# Patient Record
Sex: Female | Born: 1950 | Race: Black or African American | Hispanic: No | Marital: Married | Smoking: Former smoker
Health system: Southern US, Community
[De-identification: ages and names within clinical notes are randomized; demographics above are authoritative.]

## PROBLEM LIST (undated history)

## (undated) DIAGNOSIS — E119 Type 2 diabetes mellitus without complications: Secondary | ICD-10-CM

## (undated) DIAGNOSIS — E871 Hypo-osmolality and hyponatremia: Secondary | ICD-10-CM

## (undated) DIAGNOSIS — M75101 Unspecified rotator cuff tear or rupture of right shoulder, not specified as traumatic: Secondary | ICD-10-CM

## (undated) DIAGNOSIS — I10 Essential (primary) hypertension: Secondary | ICD-10-CM

## (undated) DIAGNOSIS — J449 Chronic obstructive pulmonary disease, unspecified: Secondary | ICD-10-CM

## (undated) DIAGNOSIS — I2510 Atherosclerotic heart disease of native coronary artery without angina pectoris: Secondary | ICD-10-CM

## (undated) DIAGNOSIS — Z955 Presence of coronary angioplasty implant and graft: Secondary | ICD-10-CM

## (undated) DIAGNOSIS — M7540 Impingement syndrome of unspecified shoulder: Secondary | ICD-10-CM

## (undated) DIAGNOSIS — Z87891 Personal history of nicotine dependence: Secondary | ICD-10-CM

## (undated) DIAGNOSIS — F1291 Cannabis use, unspecified, in remission: Secondary | ICD-10-CM

## (undated) DIAGNOSIS — E785 Hyperlipidemia, unspecified: Secondary | ICD-10-CM

## (undated) HISTORY — PX: CORONARY ANGIOPLASTY WITH STENT PLACEMENT: SHX49

## (undated) HISTORY — PX: TUBAL LIGATION: SHX77

---

## 2005-06-14 ENCOUNTER — Emergency Department: Payer: Self-pay | Admitting: Emergency Medicine

## 2005-06-14 ENCOUNTER — Other Ambulatory Visit: Payer: Self-pay

## 2007-02-05 ENCOUNTER — Emergency Department: Payer: Self-pay | Admitting: General Practice

## 2008-04-16 ENCOUNTER — Emergency Department: Payer: Self-pay | Admitting: Emergency Medicine

## 2008-04-24 ENCOUNTER — Ambulatory Visit: Payer: Self-pay | Admitting: Family Medicine

## 2009-09-29 ENCOUNTER — Inpatient Hospital Stay: Payer: Self-pay | Admitting: Specialist

## 2011-07-21 ENCOUNTER — Ambulatory Visit: Payer: Self-pay

## 2011-12-07 ENCOUNTER — Ambulatory Visit: Payer: Self-pay | Admitting: Cardiovascular Disease

## 2011-12-08 LAB — BASIC METABOLIC PANEL
BUN: 9 mg/dL (ref 7–18)
Creatinine: 0.8 mg/dL (ref 0.60–1.30)
EGFR (African American): >60
EGFR (Non-African Amer.): >60
Glucose: 249 mg/dL — ABNORMAL HIGH (ref 65–99)
Potassium: 4 mmol/L (ref 3.5–5.1)
Sodium: 140 mmol/L (ref 136–145)

## 2011-12-21 ENCOUNTER — Encounter: Payer: Self-pay | Admitting: Cardiovascular Disease

## 2011-12-31 ENCOUNTER — Encounter: Payer: Self-pay | Admitting: Cardiovascular Disease

## 2012-01-31 ENCOUNTER — Encounter: Payer: Self-pay | Admitting: Cardiovascular Disease

## 2012-03-04 ENCOUNTER — Emergency Department: Payer: Self-pay | Admitting: Internal Medicine

## 2012-03-05 ENCOUNTER — Encounter: Payer: Self-pay | Admitting: Cardiovascular Disease

## 2012-10-23 ENCOUNTER — Ambulatory Visit: Payer: Self-pay | Admitting: Internal Medicine

## 2013-08-23 ENCOUNTER — Ambulatory Visit: Payer: Self-pay | Admitting: Internal Medicine

## 2013-09-01 ENCOUNTER — Ambulatory Visit: Payer: Self-pay | Admitting: Internal Medicine

## 2013-09-16 ENCOUNTER — Emergency Department: Payer: Self-pay | Admitting: Emergency Medicine

## 2013-09-16 LAB — CBC
HCT: 44.3 % (ref 35.0–47.0)
HGB: 14.5 g/dL (ref 12.0–16.0)
MCV: 89 fL (ref 80–100)
RBC: 5 10*6/uL (ref 3.80–5.20)

## 2013-09-16 LAB — TROPONIN I: Troponin-I: <0.02 ng/mL

## 2013-09-16 LAB — CK TOTAL AND CKMB (NOT AT ARMC): CK-MB: 2.4 ng/mL (ref 0.5–3.6)

## 2013-09-16 LAB — BASIC METABOLIC PANEL
BUN: 18 mg/dL (ref 7–18)
Chloride: 106 mmol/L (ref 98–107)
EGFR (African American): 56 — ABNORMAL LOW
EGFR (Non-African Amer.): 48 — ABNORMAL LOW
Glucose: 298 mg/dL — ABNORMAL HIGH (ref 65–99)
Potassium: 3.6 mmol/L (ref 3.5–5.1)
Sodium: 137 mmol/L (ref 136–145)

## 2013-09-16 LAB — PROTIME-INR: Prothrombin Time: 11.8 secs (ref 11.5–14.7)

## 2013-09-17 LAB — TROPONIN I: Troponin-I: <0.02 ng/mL

## 2014-02-20 ENCOUNTER — Ambulatory Visit: Payer: Self-pay | Admitting: Internal Medicine

## 2014-02-23 ENCOUNTER — Emergency Department: Payer: Self-pay | Admitting: Internal Medicine

## 2014-03-15 ENCOUNTER — Ambulatory Visit: Payer: Self-pay | Admitting: Orthopedic Surgery

## 2015-02-23 NOTE — Discharge Summary (Signed)
PATIENT NAME:  Brenda Best, Brenda J MR#:  Best DATE OF BIRTH:  1951/06/21  DATE OF ADMISSION:  12/07/2011 DATE OF DISCHARGE:  12/08/2011  HISTORY OF PRESENT ILLNESS/HOSPITAL COURSE: This is a 64 year old female who came into the hospital for outpatient cardiac catheterization. She had a 75% lesion in the proximal RCA and 70% in the mid to distal RCA, thus underwent angioplasty and drug-eluting stent placement in the right coronary artery in the proximal as well as mid to distal right coronary. She was observed overnight and is being discharged with follow-up in the office in two days.   CONDITION ON DISCHARGE: She is being discharged in stable condition.  DISCHARGE MEDICATIONS:  1. Aspirin 325. 2. Plavix 75 mg p.o. daily.   3. Usual medications.   Her sugar was elevated to 300, thus she was being placed on metformin also before being discharged. She will be set up for medical doctor in [facility].   DISCHARGE DIAGNOSES:  1. Coronary artery disease status post PCI stenting with drug-eluting stent in the right coronary in the proximal and mid to distal right coronary. 2. Elevated blood sugar most likely due to diabetes. 3. Hypertension. 4. Hypercholesterolemia. 5. Nicotine addiction.   FOLLOW-UP: Follow-up in the office this week.   ____________________________ Laurier NancyShaukat A. Khan, MD sak:drc D: 12/09/2011 09:15:32 ET T: 12/09/2011 09:23:49 ET JOB#: [id number] cc: Laurier NancyShaukat A. Khan, MD, <Dictator> Laurier NancySHAUKAT A KHAN MD ELECTRONICALLY SIGNED 12/22/2011 11:22

## 2015-06-12 ENCOUNTER — Other Ambulatory Visit: Payer: Self-pay | Admitting: Internal Medicine

## 2015-06-12 DIAGNOSIS — Z1231 Encounter for screening mammogram for malignant neoplasm of breast: Secondary | ICD-10-CM

## 2015-06-13 ENCOUNTER — Ambulatory Visit: Payer: Self-pay | Attending: Internal Medicine

## 2016-02-09 ENCOUNTER — Other Ambulatory Visit: Payer: Self-pay | Admitting: Internal Medicine

## 2016-02-09 DIAGNOSIS — M25512 Pain in left shoulder: Secondary | ICD-10-CM

## 2016-02-09 DIAGNOSIS — M25511 Pain in right shoulder: Secondary | ICD-10-CM

## 2016-03-01 ENCOUNTER — Ambulatory Visit
Admission: RE | Admit: 2016-03-01 | Discharge: 2016-03-01 | Disposition: A | Discharge status: Home / Self Care | Payer: Medicare HMO | Source: Ambulatory Visit | Attending: Internal Medicine | Admitting: Internal Medicine

## 2016-03-01 DIAGNOSIS — M25512 Pain in left shoulder: Secondary | ICD-10-CM | POA: Insufficient documentation

## 2016-03-01 DIAGNOSIS — M25511 Pain in right shoulder: Secondary | ICD-10-CM

## 2016-03-01 DIAGNOSIS — M75112 Incomplete rotator cuff tear or rupture of left shoulder, not specified as traumatic: Secondary | ICD-10-CM | POA: Diagnosis not present

## 2016-03-01 DIAGNOSIS — M75121 Complete rotator cuff tear or rupture of right shoulder, not specified as traumatic: Secondary | ICD-10-CM | POA: Diagnosis not present

## 2016-03-01 DIAGNOSIS — M62511 Muscle wasting and atrophy, not elsewhere classified, right shoulder: Secondary | ICD-10-CM | POA: Diagnosis not present

## 2016-03-01 DIAGNOSIS — M13811 Other specified arthritis, right shoulder: Secondary | ICD-10-CM | POA: Insufficient documentation

## 2016-03-01 DIAGNOSIS — M13812 Other specified arthritis, left shoulder: Secondary | ICD-10-CM | POA: Insufficient documentation

## 2016-05-18 ENCOUNTER — Encounter: Payer: Self-pay | Admitting: Emergency Medicine

## 2016-05-18 ENCOUNTER — Emergency Department
Admission: EM | Admit: 2016-05-18 | Discharge: 2016-05-18 | Disposition: A | Discharge status: Home / Self Care | Payer: Medicare HMO | Attending: Emergency Medicine | Admitting: Emergency Medicine

## 2016-05-18 DIAGNOSIS — I2510 Atherosclerotic heart disease of native coronary artery without angina pectoris: Secondary | ICD-10-CM | POA: Diagnosis not present

## 2016-05-18 DIAGNOSIS — S00412A Abrasion of left ear, initial encounter: Secondary | ICD-10-CM

## 2016-05-18 DIAGNOSIS — I10 Essential (primary) hypertension: Secondary | ICD-10-CM | POA: Diagnosis not present

## 2016-05-18 DIAGNOSIS — Y939 Activity, unspecified: Secondary | ICD-10-CM | POA: Insufficient documentation

## 2016-05-18 DIAGNOSIS — Y999 Unspecified external cause status: Secondary | ICD-10-CM | POA: Diagnosis not present

## 2016-05-18 DIAGNOSIS — Z87891 Personal history of nicotine dependence: Secondary | ICD-10-CM | POA: Diagnosis not present

## 2016-05-18 DIAGNOSIS — Y929 Unspecified place or not applicable: Secondary | ICD-10-CM | POA: Insufficient documentation

## 2016-05-18 DIAGNOSIS — X58XXXA Exposure to other specified factors, initial encounter: Secondary | ICD-10-CM | POA: Insufficient documentation

## 2016-05-18 DIAGNOSIS — H9222 Otorrhagia, left ear: Secondary | ICD-10-CM | POA: Diagnosis present

## 2016-05-18 HISTORY — DX: Essential (primary) hypertension: I10

## 2016-05-18 HISTORY — DX: Atherosclerotic heart disease of native coronary artery without angina pectoris: I25.10

## 2016-05-18 MED ORDER — CIPROFLOXACIN-DEXAMETHASONE 0.3-0.1 % OT SUSP
4.0000 [drp] | Freq: Once | OTIC | Status: AC
  Administered 2016-05-18: 4 [drp] via OTIC
  Filled 2016-05-18: qty 7.5

## 2016-05-18 NOTE — Discharge Instructions (Signed)
Use ciprodex twice daily for 3-5 days.   Change the cotton ball twice daily when you use your drops.   See your doctor.   Avoid putting anything else in the ear.   Return to ER if you have severe ear pain, uncontrolled bleeding from ear

## 2016-05-18 NOTE — ED Notes (Addendum)
Patient ambulatory to triage with steady gait, without difficulty or distress noted; pt reports awoke with bleeding from left ear canal; pt denies any recent illness; denies any c/o pain; pt currently taking plavix

## 2016-05-18 NOTE — ED Provider Notes (Signed)
CSN: [id number]     Arrival date & time 05/18/16  0600 History   First MD Initiated Contact with Patient 05/18/16 0703     Chief Complaint  Patient presents with  . Ear Drainage     (Consider location/radiation/quality/duration/timing/severity/associated sxs/prior Treatment) The history is provided by the patient.  Brenda Best is a 65 y.o. female hx of CAD, HTN, Here presenting with left ear bleeding. Patient states that her left ear is itchy so she is taking a Q-tip into the left ear canal. She then noticed profuse bleeding from the left ear. Denies any fall or injury to the year otherwise. She states that she is taking Plavix since she has cardiac stents but denies any chest pain or shortness of breath.    Past Medical History  Diagnosis Date  . Coronary artery disease   . Hypertension    History reviewed. No pertinent past surgical history. History reviewed. No pertinent family history. Social History  Substance Use Topics  . Smoking status: Former Smoker -- 0.50 packs/day    Types: Cigarettes  . Smokeless tobacco: None  . Alcohol Use: None   OB History    No data available     Review of Systems  HENT:       L ear bleeding   All other systems reviewed and are negative.     Allergies  Review of patient's allergies indicates no known allergies.  Home Medications   Prior to Admission medications   Not on File   BP 142/85 mmHg  Pulse 76  Temp(Src) 97.5 F (36.4 C) (Oral)  Resp 16  Ht 5\' 3"  (1.6 m)  Wt 162 lb (73.483 kg)  BMI 28.70 kg/m2  SpO2 96% Physical Exam  Constitutional: She is oriented to person, place, and time. She appears well-developed and well-nourished.  HENT:  Head: Normocephalic.  Mouth/Throat: Oropharynx is clear and moist.  Bleeding from L ear canal with small abrasion. L TM not perforated. No obvious laceration or otitis externa   Eyes: Conjunctivae are normal. Pupils are equal, round, and reactive to light.  Neck: Normal range of  motion. Neck supple.  Cardiovascular: Normal rate, regular rhythm and normal heart sounds.   Pulmonary/Chest: Effort normal and breath sounds normal.  Abdominal: Soft. Bowel sounds are normal. She exhibits no distension. There is no tenderness. There is no rebound.  Musculoskeletal: Normal range of motion. She exhibits no edema or tenderness.  Neurological: She is alert and oriented to person, place, and time.  Skin: Skin is warm and dry.  Psychiatric: She has a normal mood and affect. Her behavior is normal. Judgment and thought content normal.  Nursing note and vitals reviewed.   ED Course  Procedures (including critical care time) Labs Review Labs Reviewed - No data to display  Imaging Review No results found. I have personally reviewed and evaluated these images and lab results as part of my medical decision-making.   EKG Interpretation None      MDM   Final diagnoses:  None    Brenda Best is a 65 y.o. female here with L ear canal abrasion from Q tip. TM not perforated, no obvious otitis media. Afebrile. On plavix so she has some bleeding. Will give ciprodex for prophylaxis and place cotton ball to help with bleeding.   8:05 AM Cotton ball placed and bleeding stopped. Will dc home with ciprodex and I told her to apply cotton ball when she uses the drops. Gave strict return precautions.  Charlynne Pander, MD 05/18/16 [phone]

## 2016-07-22 ENCOUNTER — Other Ambulatory Visit: Payer: Self-pay | Admitting: Internal Medicine

## 2016-07-22 DIAGNOSIS — Z1231 Encounter for screening mammogram for malignant neoplasm of breast: Secondary | ICD-10-CM

## 2016-08-16 ENCOUNTER — Encounter: Payer: Self-pay | Admitting: Radiology

## 2016-08-16 ENCOUNTER — Ambulatory Visit
Admission: RE | Admit: 2016-08-16 | Discharge: 2016-08-16 | Disposition: A | Discharge status: Home / Self Care | Payer: Medicare HMO | Source: Ambulatory Visit | Attending: Internal Medicine | Admitting: Internal Medicine

## 2016-08-16 DIAGNOSIS — Z1231 Encounter for screening mammogram for malignant neoplasm of breast: Secondary | ICD-10-CM | POA: Insufficient documentation

## 2017-01-15 ENCOUNTER — Encounter: Payer: Self-pay | Admitting: Emergency Medicine

## 2017-01-15 ENCOUNTER — Emergency Department
Admission: EM | Admit: 2017-01-15 | Discharge: 2017-01-15 | Disposition: A | Discharge status: Home / Self Care | Payer: Medicare HMO | Attending: Emergency Medicine | Admitting: Emergency Medicine

## 2017-01-15 DIAGNOSIS — I10 Essential (primary) hypertension: Secondary | ICD-10-CM | POA: Diagnosis not present

## 2017-01-15 DIAGNOSIS — M7592 Shoulder lesion, unspecified, left shoulder: Secondary | ICD-10-CM | POA: Diagnosis not present

## 2017-01-15 DIAGNOSIS — I2510 Atherosclerotic heart disease of native coronary artery without angina pectoris: Secondary | ICD-10-CM | POA: Insufficient documentation

## 2017-01-15 DIAGNOSIS — Z87891 Personal history of nicotine dependence: Secondary | ICD-10-CM | POA: Insufficient documentation

## 2017-01-15 DIAGNOSIS — M25512 Pain in left shoulder: Secondary | ICD-10-CM | POA: Diagnosis present

## 2017-01-15 DIAGNOSIS — M7582 Other shoulder lesions, left shoulder: Secondary | ICD-10-CM

## 2017-01-15 MED ORDER — MELOXICAM 15 MG PO TABS: 15.0000 mg | ORAL_TABLET | Freq: Every day | ORAL | 0 refills | Status: DC

## 2017-01-15 NOTE — ED Triage Notes (Signed)
Had cortisone shot 5 months ago and last 3 weeks cramping muscles all over. Icy hot, tramadol, tylenol don't work. States was told needs rotator cuff surgery.

## 2017-01-15 NOTE — ED Triage Notes (Signed)
Pt states the only thing that works for the pain is the cortisone shot. Has not made appt with ortho for another one.

## 2017-01-15 NOTE — ED Provider Notes (Signed)
[facility] Emergency Department Provider Note  ____________________________________________  Time seen: Approximately 4:05 PM  I have reviewed the triage vital signs and the nursing notes.   HISTORY  Chief Complaint Joint Pain    HPI Brenda PlumeBarbara J Best is a 66 y.o. female with bilateral shoulder pain. She has been seeing orthopedics for what she believes is a rotator cuff injury in both her shoulders. They have been managing this with Cortisone injections but anticipated having to do surgery. She last received an injection about 5 months and had significant relief from the pain. She was suppose to have PT but did not go as the pain had resolved. She presents to the ED today because the pain has continued to worsen over the last few weeks. She has tried tylenol, heating pads, and Bengay without improvement of the pain. She endorses limited range of motion particularly when reaching behind her back behind her. She denied any trauma or injury prior to the onset of the pain. She believes she is following with Dr. Hyacinth MeekerMiller for this issue.    Past Medical History:  Diagnosis Date  . Coronary artery disease   . Hypertension     There are no active problems to display for this patient.   History reviewed. No pertinent surgical history.  Prior to Admission medications   Medication Sig Start Date End Date Taking? Authorizing Provider  meloxicam (MOBIC) 15 MG tablet Take 1 tablet (15 mg total) by mouth daily. 01/15/17   Delorise RoyalsJonathan D Elizar Alpern, PA-C    Allergies Patient has no known allergies.  History reviewed. No pertinent family history.  Social History Social History  Substance Use Topics  . Smoking status: Former Smoker    Packs/day: 0.50    Types: Cigarettes  . Smokeless tobacco: Never Used  . Alcohol use No     Review of Systems  Constitutional: No fever/chills Eyes: No visual changes. No discharge ENT: No upper respiratory  complaints. Cardiovascular: no chest pain. Respiratory: no cough. No SOB. Gastrointestinal: No abdominal pain.  No nausea, no vomiting.  No diarrhea.  No constipation. Musculoskeletal: Positive for shoulder pain Skin: Negative for rash, abrasions, lacerations, ecchymosis. Neurological: Negative for headaches, focal weakness or numbness. 10-point ROS otherwise negative.  ____________________________________________   PHYSICAL EXAM:  VITAL SIGNS: ED Triage Vitals  Enc Vitals Group     BP 01/15/17 1407 132/68     Pulse Rate 01/15/17 1407 73     Resp 01/15/17 1407 17     Temp 01/15/17 1407 97.9 F (36.6 C)     Temp Source 01/15/17 1407 Oral     SpO2 01/15/17 1407 95 %     Weight 01/15/17 1408 157 lb (71.2 kg)     Height 01/15/17 1408 5\' 3"  (1.6 m)     Head Circumference --      Peak Flow --      Pain Score 01/15/17 1427 10     Pain Loc --      Pain Edu? --      Excl. in GC? --      Constitutional: Alert and oriented. Pt tearful Eyes: Conjunctivae are normal. PERRL. EOMI. Head: Atraumatic. ENT:      Ears:       Nose: No congestion/rhinnorhea.      Mouth/Throat: Mucous membranes are moist.  Neck: No stridor.   Cardiovascular: Normal rate, regular rhythm. Normal S1 and S2.  Good peripheral circulation. Respiratory: Normal respiratory effort without tachypnea or retractions. Lungs CTAB. Good  air entry to the bases with no decreased or absent breath sounds. Musculoskeletal: Left shoulder is diffusely tender throughout, Pt can Flex to 90 degrees additional ROM testing limited 2/2 pt pain, positive empty can test on the left. Neurologic:  Normal speech and language. No gross focal neurologic deficits are appreciated.  Skin:  Skin is warm, dry and intact. No rash noted. Psychiatric: Mood and affect are normal. Speech and behavior are normal. Patient exhibits appropriate insight and judgement.   ____________________________________________   LABS (all labs ordered are listed,  but only abnormal results are displayed)  Labs Reviewed - No data to display ____________________________________________  EKG   ____________________________________________  RADIOLOGY   No results found.  ____________________________________________    PROCEDURES  Procedure(s) performed:    Procedures    Medications - No data to display   ____________________________________________   INITIAL IMPRESSION / ASSESSMENT AND PLAN / ED COURSE  Pertinent labs & imaging results that were available during my care of the patient were reviewed by me and considered in my medical decision making (see chart for details).  Review of the [place] CSRS was performed in accordance of the [facility] prior to dispensing any controlled drugs.     Patient's diagnosis is consistent with rotator cuff tendinitis of the left shoulder. This is a chronic issue. Patient has been seen by orthopedics and has received cortisone injections into the left shoulder. Patient reports that as pain has returned she did not follow-up with orthopedics. At this time, I'll place patient on anti-inflammatories but advised that she follow-up with orthopedics for further evaluation or possible repeat cortisone injection..  Patient is given ED precautions to return to the ED for any worsening or new symptoms.     ____________________________________________  FINAL CLINICAL IMPRESSION(S) / ED DIAGNOSES  Final diagnoses:  Rotator cuff tendinitis, left      NEW MEDICATIONS STARTED DURING THIS VISIT:  New Prescriptions   MELOXICAM (MOBIC) 15 MG TABLET    Take 1 tablet (15 mg total) by mouth daily.        This chart was dictated using voice recognition software/Dragon. Despite best efforts to proofread, errors can occur which can change the meaning. Any change was purely unintentional.    Racheal Patches, PA-C 01/15/17 1622    Phineas Semen, MD 01/15/17 [phone]

## 2017-12-11 ENCOUNTER — Emergency Department: Payer: Medicare HMO

## 2017-12-11 ENCOUNTER — Emergency Department
Admission: EM | Admit: 2017-12-11 | Discharge: 2017-12-11 | Disposition: A | Discharge status: Home / Self Care | Payer: Medicare HMO | Attending: Emergency Medicine | Admitting: Emergency Medicine

## 2017-12-11 ENCOUNTER — Encounter: Payer: Self-pay | Admitting: Emergency Medicine

## 2017-12-11 DIAGNOSIS — I2510 Atherosclerotic heart disease of native coronary artery without angina pectoris: Secondary | ICD-10-CM | POA: Diagnosis not present

## 2017-12-11 DIAGNOSIS — R0981 Nasal congestion: Secondary | ICD-10-CM | POA: Diagnosis not present

## 2017-12-11 DIAGNOSIS — R05 Cough: Secondary | ICD-10-CM | POA: Diagnosis present

## 2017-12-11 DIAGNOSIS — F17210 Nicotine dependence, cigarettes, uncomplicated: Secondary | ICD-10-CM | POA: Insufficient documentation

## 2017-12-11 DIAGNOSIS — J209 Acute bronchitis, unspecified: Secondary | ICD-10-CM | POA: Diagnosis not present

## 2017-12-11 DIAGNOSIS — I10 Essential (primary) hypertension: Secondary | ICD-10-CM | POA: Insufficient documentation

## 2017-12-11 DIAGNOSIS — Z79899 Other long term (current) drug therapy: Secondary | ICD-10-CM | POA: Insufficient documentation

## 2017-12-11 LAB — BASIC METABOLIC PANEL
Anion gap: 9 (ref 5–15)
BUN: 14 mg/dL (ref 6–20)
CO2: 25 mmol/L (ref 22–32)
CREATININE: 0.85 mg/dL (ref 0.44–1.00)
Calcium: 9.3 mg/dL (ref 8.9–10.3)
Chloride: 105 mmol/L (ref 101–111)
GFR calc Af Amer: >60 mL/min (ref >60)
GLUCOSE: 158 mg/dL — AB (ref 65–99)
Potassium: 4 mmol/L (ref 3.5–5.1)
SODIUM: 139 mmol/L (ref 135–145)

## 2017-12-11 LAB — CBC
HEMATOCRIT: 43 % (ref 35.0–47.0)
Hemoglobin: 14.4 g/dL (ref 12.0–16.0)
MCH: 29.4 pg (ref 26.0–34.0)
MCHC: 33.5 g/dL (ref 32.0–36.0)
MCV: 87.7 fL (ref 80.0–100.0)
PLATELETS: 172 10*3/uL (ref 150–440)
RBC: 4.9 MIL/uL (ref 3.80–5.20)
RDW: 14.1 % (ref 11.5–14.5)
WBC: 7.4 10*3/uL (ref 3.6–11.0)

## 2017-12-11 LAB — TROPONIN I: Troponin I: <0.03 ng/mL (ref <0.03)

## 2017-12-11 MED ORDER — ALBUTEROL SULFATE HFA 108 (90 BASE) MCG/ACT IN AERS: 2.0000 | INHALATION_SPRAY | Freq: Four times a day (QID) | RESPIRATORY_TRACT | 0 refills | Status: AC | PRN

## 2017-12-11 MED ORDER — AZITHROMYCIN 500 MG PO TABS
500.0000 mg | ORAL_TABLET | Freq: Once | ORAL | Status: AC
  Administered 2017-12-11: 500 mg via ORAL
  Filled 2017-12-11: qty 1

## 2017-12-11 MED ORDER — AZITHROMYCIN 500 MG PO TABS: 500.0000 mg | ORAL_TABLET | Freq: Every day | ORAL | 0 refills | Status: AC

## 2017-12-11 MED ORDER — BENZONATATE 100 MG PO CAPS: 100.0000 mg | ORAL_CAPSULE | Freq: Three times a day (TID) | ORAL | 0 refills | Status: AC | PRN

## 2017-12-11 MED ORDER — HYDROCOD POLST-CPM POLST ER 10-8 MG/5ML PO SUER
5.0000 mL | Freq: Once | ORAL | Status: AC
  Administered 2017-12-11: 5 mL via ORAL
  Filled 2017-12-11: qty 5

## 2017-12-11 NOTE — ED Provider Notes (Signed)
[facility] Emergency Department Provider Note   First MD Initiated Contact with Patient 12/11/17 [phone]     (approximate)  I have reviewed the triage vital signs and the nursing notes.   HISTORY  Chief Complaint Cough    HPI Brenda Best is a 67 y.o. female with below list of chronic medical conditions presents to the emergency department with a 2-day history of productive cough with yellowish green sputum.  Patient denies any fever however does admit to acute nasal congestion.  Patient does admit to half a pack cigarette smoking used per day.  Patient denies any chest pain.   Past Medical History:  Diagnosis Date  . Coronary artery disease   . Hypertension     There are no active problems to display for this patient.   Past Surgical History:  Procedure Laterality Date  . CORONARY ANGIOPLASTY WITH STENT PLACEMENT      Prior to Admission medications   Medication Sig Start Date End Date Taking? Authorizing Provider  meloxicam (MOBIC) 15 MG tablet Take 1 tablet (15 mg total) by mouth daily. 01/15/17   Cuthriell, Delorise Royals, PA-C    Allergies Excedrin back & [acetaminophen-aspirin buffered]  No family history on file.  Social History Social History   Tobacco Use  . Smoking status: Current Every Day Smoker    Packs/day: 0.50    Types: Cigarettes  . Smokeless tobacco: Never Used  Substance Use Topics  . Alcohol use: Yes    Comment: rarely  . Drug use: Yes    Types: Marijuana    Review of Systems Constitutional: No fever/chills Eyes: No visual changes. ENT: No sore throat.  Positive for nasal congestion Cardiovascular: Denies chest pain. Respiratory: Denies shortness of breath.  Positive for cough Gastrointestinal: No abdominal pain.  No nausea, no vomiting.  No diarrhea.  No constipation. Genitourinary: Negative for dysuria. Musculoskeletal: Negative for neck pain.  Negative for back pain. Integumentary: Negative for  rash. Neurological: Negative for headaches, focal weakness or numbness.   ____________________________________________   PHYSICAL EXAM:  VITAL SIGNS: ED Triage Vitals  Enc Vitals Group     BP 12/11/17 0113 (!) 124/58     Pulse Rate 12/11/17 0113 93     Resp 12/11/17 0113 18     Temp 12/11/17 0113 98.2 F (36.8 C)     Temp Source 12/11/17 0113 Oral     SpO2 12/11/17 0113 96 %     Weight 12/11/17 0114 68.9 kg (152 lb)     Height 12/11/17 0114 1.6 m (5\' 3" )     Head Circumference --      Peak Flow --      Pain Score 12/11/17 0113 3     Pain Loc --      Pain Edu? --      Excl. in GC? --     Constitutional: Alert and oriented. Well appearing and in no acute distress. Eyes: Conjunctivae are normal. Head: Atraumatic. Mouth/Throat: Mucous membranes are moist. Oropharynx non-erythematous. Neck: No stridor.   Cardiovascular: Normal rate, regular rhythm. Good peripheral circulation. Grossly normal heart sounds. Respiratory: Normal respiratory effort.  No retractions. Lungs CTAB. Gastrointestinal: Soft and nontender. No distention.  Musculoskeletal: No lower extremity tenderness nor edema. No gross deformities of extremities. Neurologic:  Normal speech and language. No gross focal neurologic deficits are appreciated.  Skin:  Skin is warm, dry and intact. No rash noted. Psychiatric: Mood and affect are normal. Speech and behavior are normal.  ____________________________________________   LABS (all labs ordered are listed, but only abnormal results are displayed)  Labs Reviewed  BASIC METABOLIC PANEL - Abnormal; Notable for the following components:      Result Value   Glucose, Bld 158 (*)    All other components within normal limits  CBC  TROPONIN I   ____________________________________________  EKG  ED ECG REPORT I, [place] [address] [place], the attending physician, personally viewed and interpreted this ECG.   Date: 12/11/2017  EKG Time: 2:37 AM  Rate: 89  Rhythm:  Normal sinus rhythm  Axis: Normal  Intervals: Normal  ST&T Change: None  ____________________________________________  RADIOLOGY I, [place] Kuzey Ogata, personally viewed and evaluated these images (plain radiographs) as part of my medical decision making, as well as reviewing the written report by the radiologist.  ED MD interpretation: Chest x-ray finding consistent with possible bronchitis.  No evidence of pneumonia noted  Official radiology report(s): Dg Chest 2 View  Result Date: 12/11/2017 CLINICAL DATA:  Cough for 1 week EXAM: CHEST  2 VIEW COMPARISON:  09/16/2013 FINDINGS: Mild central airways thickening. No focal consolidation or effusion. Normal cardiomediastinal silhouette with aortic atherosclerosis. No pneumothorax. IMPRESSION: Mild central airways thickening suggesting bronchial inflammation. No focal pulmonary infiltrate. Electronically Signed   By: Jasmine PangKim  Fujinaga M.D.   On: 12/11/2017 01:28      Procedures   ____________________________________________   INITIAL IMPRESSION / ASSESSMENT AND PLAN / ED COURSE  As part of my medical decision making, I reviewed the following data within the electronic MEDICAL RECORD [id number] year old female present with above-stated history of productive cough and congestion.  Concern for possible pneumonia versus bronchitis and as such chest x-ray obtained which revealed no evidence of pneumonia but was consistent with bronchitis.  Patient given azithromycin in the Tussionex will be prescribed Tessalon Perles and azithromycin as well as albuterol for home.  Discussed about smoking cessation with the patient ____________________________________________  FINAL CLINICAL IMPRESSION(S) / ED DIAGNOSES  Final diagnoses:  Acute bronchitis, unspecified organism     MEDICATIONS GIVEN DURING THIS VISIT:  Medications  chlorpheniramine-HYDROcodone (TUSSIONEX) 10-8 MG/5ML suspension 5 mL (5 mLs Oral Given 12/11/17 0447)  azithromycin (ZITHROMAX) tablet  500 mg (500 mg Oral Given 12/11/17 0447)     ED Discharge Orders    None       Note:  This document was prepared using Dragon voice recognition software and may include unintentional dictation errors.    Darci CurrentBrown, Percival N, MD 12/11/17 [phone]

## 2017-12-11 NOTE — ED Notes (Signed)
Patient resting quietly with eyes closed, in no acute distress at this time.

## 2017-12-11 NOTE — ED Notes (Signed)
Patient now with complaint of left side chest pain that started after coughing.

## 2017-12-11 NOTE — ED Triage Notes (Addendum)
Patient states that she has been coughing for 3 days. Patient states that the cough has become worse and now productive. Patient reports that sputum is yellow. Patient denies fever.

## 2018-01-04 ENCOUNTER — Emergency Department
Admission: EM | Admit: 2018-01-04 | Discharge: 2018-01-04 | Disposition: A | Discharge status: Home / Self Care | Payer: Medicare HMO | Attending: Emergency Medicine | Admitting: Emergency Medicine

## 2018-01-04 ENCOUNTER — Other Ambulatory Visit: Payer: Self-pay

## 2018-01-04 ENCOUNTER — Emergency Department: Payer: Medicare HMO

## 2018-01-04 ENCOUNTER — Encounter: Payer: Self-pay | Admitting: Emergency Medicine

## 2018-01-04 DIAGNOSIS — I10 Essential (primary) hypertension: Secondary | ICD-10-CM | POA: Insufficient documentation

## 2018-01-04 DIAGNOSIS — J40 Bronchitis, not specified as acute or chronic: Secondary | ICD-10-CM | POA: Diagnosis not present

## 2018-01-04 DIAGNOSIS — Z79899 Other long term (current) drug therapy: Secondary | ICD-10-CM | POA: Diagnosis not present

## 2018-01-04 DIAGNOSIS — R0789 Other chest pain: Secondary | ICD-10-CM

## 2018-01-04 DIAGNOSIS — F17210 Nicotine dependence, cigarettes, uncomplicated: Secondary | ICD-10-CM | POA: Diagnosis not present

## 2018-01-04 DIAGNOSIS — Z955 Presence of coronary angioplasty implant and graft: Secondary | ICD-10-CM | POA: Diagnosis not present

## 2018-01-04 DIAGNOSIS — I2510 Atherosclerotic heart disease of native coronary artery without angina pectoris: Secondary | ICD-10-CM | POA: Insufficient documentation

## 2018-01-04 DIAGNOSIS — R05 Cough: Secondary | ICD-10-CM | POA: Diagnosis present

## 2018-01-04 LAB — CBC WITH DIFFERENTIAL/PLATELET
BASOS PCT: 2 %
Basophils Absolute: 0.2 10*3/uL — ABNORMAL HIGH (ref 0–0.1)
EOS ABS: 0.3 10*3/uL (ref 0–0.7)
Eosinophils Relative: 3 %
HCT: 42.3 % (ref 35.0–47.0)
HEMOGLOBIN: 13.8 g/dL (ref 12.0–16.0)
LYMPHS ABS: 2.4 10*3/uL (ref 1.0–3.6)
Lymphocytes Relative: 26 %
MCH: 29 pg (ref 26.0–34.0)
MCHC: 32.7 g/dL (ref 32.0–36.0)
MCV: 88.6 fL (ref 80.0–100.0)
Monocytes Absolute: 0.6 10*3/uL (ref 0.2–0.9)
Monocytes Relative: 7 %
NEUTROS PCT: 62 %
Neutro Abs: 5.6 10*3/uL (ref 1.4–6.5)
Platelets: 206 10*3/uL (ref 150–440)
RBC: 4.77 MIL/uL (ref 3.80–5.20)
RDW: 14.6 % — ABNORMAL HIGH (ref 11.5–14.5)
WBC: 9.1 10*3/uL (ref 3.6–11.0)

## 2018-01-04 LAB — COMPREHENSIVE METABOLIC PANEL
ALBUMIN: 3.6 g/dL (ref 3.5–5.0)
ALT: 13 U/L — ABNORMAL LOW (ref 14–54)
ANION GAP: 11 (ref 5–15)
AST: 21 U/L (ref 15–41)
Alkaline Phosphatase: 70 U/L (ref 38–126)
BILIRUBIN TOTAL: 0.6 mg/dL (ref 0.3–1.2)
BUN: 15 mg/dL (ref 6–20)
CO2: 21 mmol/L — ABNORMAL LOW (ref 22–32)
Calcium: 9 mg/dL (ref 8.9–10.3)
Chloride: 103 mmol/L (ref 101–111)
Creatinine, Ser: 0.77 mg/dL (ref 0.44–1.00)
GFR calc non Af Amer: >60 mL/min (ref >60)
GLUCOSE: 137 mg/dL — AB (ref 65–99)
POTASSIUM: 3.6 mmol/L (ref 3.5–5.1)
Sodium: 135 mmol/L (ref 135–145)
TOTAL PROTEIN: 7.4 g/dL (ref 6.5–8.1)

## 2018-01-04 LAB — FIBRIN DERIVATIVES D-DIMER (ARMC ONLY): FIBRIN DERIVATIVES D-DIMER (ARMC): 555.37 ng{FEU}/mL — AB (ref 0.00–499.00)

## 2018-01-04 LAB — TROPONIN I: Troponin I: <0.03 ng/mL (ref <0.03)

## 2018-01-04 LAB — BRAIN NATRIURETIC PEPTIDE: B NATRIURETIC PEPTIDE 5: 25 pg/mL (ref 0.0–100.0)

## 2018-01-04 MED ORDER — AMOXICILLIN-POT CLAVULANATE 875-125 MG PO TABS: 1.0000 | ORAL_TABLET | Freq: Two times a day (BID) | ORAL | 0 refills | Status: AC

## 2018-01-04 MED ORDER — IOPAMIDOL (ISOVUE-370) INJECTION 76%
75.0000 mL | Freq: Once | INTRAVENOUS | Status: AC | PRN
  Administered 2018-01-04: 75 mL via INTRAVENOUS

## 2018-01-04 NOTE — Discharge Instructions (Signed)
continued to use your inhaler 2 puffs 4 times a day. Use the Augmentin 1 pill twice a day with food. If you take it without food it may give you diarrhea. Please return if you're worse at all. Please follow-up with your regular doctor within the week. Return also for worse chest pain or tightness. call Dr. Juliann Paresallwood, the cardiologist in the morning. He can follow-up` just to double check on your chest pain.

## 2018-01-04 NOTE — ED Triage Notes (Signed)
Pt arrived via EMS from home d/t  Sudden onset of right sided chest pain. Pt also c/o productive cough x3 weeks. Pt has hx if MI and 3 stent plaecements. Pt reports SOB. Pt denies any dizziness. Pt is A&O x4 at this time.

## 2018-01-04 NOTE — ED Provider Notes (Signed)
[facility] Emergency Department Provider Note   ____________________________________________   First MD Initiated Contact with Patient 01/04/18 1437     (approximate)  I have reviewed the triage vital signs and the nursing notes.   HISTORY  Chief Complaint Chest Pain    HPI Brenda Best is a 67 y.o. female Patient reports a cough for 3 weeks. she brings up yellow phlegm. She was seen in the ER about 3 weeks ago and got Zithromax and Tessalon Perles cut better but then got worse again. Today she was coughing had sudden onset of right-sided chest pain in the hard area developed under the right breast like a muscle spasm almost. Patient reports some shortness of breath with all of this.   Past Medical History:  Diagnosis Date  . Coronary artery disease   . Hypertension     There are no active problems to display for this patient.   Past Surgical History:  Procedure Laterality Date  . CORONARY ANGIOPLASTY WITH STENT PLACEMENT      Prior to Admission medications   Medication Sig Start Date End Date Taking? Authorizing Provider  albuterol (PROVENTIL HFA;VENTOLIN HFA) 108 (90 Base) MCG/ACT inhaler Inhale 2 puffs into the lungs every 6 (six) hours as needed for wheezing or shortness of breath. 12/11/17   Darci Current, MD  amoxicillin-clavulanate (AUGMENTIN) 875-125 MG tablet Take 1 tablet by mouth 2 (two) times daily for 10 days. 01/04/18 01/14/18  Arnaldo Natal, MD  benzonatate (TESSALON PERLES) 100 MG capsule Take 1 capsule (100 mg total) by mouth 3 (three) times daily as needed for cough. 12/11/17 12/11/18  Darci Current, MD  meloxicam (MOBIC) 15 MG tablet Take 1 tablet (15 mg total) by mouth daily. 01/15/17   Cuthriell, Delorise Royals, PA-C    Allergies Excedrin back & [acetaminophen-aspirin buffered]  History reviewed. No pertinent family history.  Social History Social History   Tobacco Use  . Smoking status: Current Every Day Smoker      Packs/day: 0.50    Types: Cigarettes  . Smokeless tobacco: Never Used  Substance Use Topics  . Alcohol use: Yes    Comment: rarely  . Drug use: Yes    Types: Marijuana    Review of Systems  Constitutional: No fever/chills Eyes: No visual changes. ENT: No sore throat. Cardiovascular: chest pain. Respiratory:shortness of breath. Gastrointestinal: No abdominal pain.  No nausea, no vomiting.  No diarrhea.  No constipation. Genitourinary: Negative for dysuria. Musculoskeletal: Negative for back pain. Skin: Negative for rash. Neurological: Negative for headaches, focal weakness   ____________________________________________   PHYSICAL EXAM:  VITAL SIGNS: ED Triage Vitals  Enc Vitals Group     BP 01/04/18 1438 133/83     Pulse Rate 01/04/18 1438 73     Resp 01/04/18 1438 17     Temp 01/04/18 1438 98.1 F (36.7 C)     Temp Source 01/04/18 1438 Oral     SpO2 01/04/18 1437 94 %     Weight 01/04/18 1439 153 lb (69.4 kg)     Height 01/04/18 1439 5\' 3"  (1.6 m)     Head Circumference --      Peak Flow --      Pain Score 01/04/18 1439 0     Pain Loc --      Pain Edu? --      Excl. in GC? --     Constitutional: Alert and oriented. Well appearing and in no acute distress. Eyes: Conjunctivae  are normal.  Head: Atraumatic. Nose: No congestion/rhinnorhea. Mouth/Throat: Mucous membranes are moist.  Oropharynx non-erythematous. Neck: No stridor.   Cardiovascular: Normal rate, regular rhythm. Grossly normal heart sounds.  Good peripheral circulation. Respiratory: Normal respiratory effort.  No retractions. Lungs CTAB. her no lumps or masses palpable in the chest. Chest wall is tenderness below the right breast in the area of the pain. Gastrointestinal: Soft and nontender. No distention. No abdominal bruits. No CVA tenderness. Musculoskeletal: No lower extremity tenderness nor edema.  No joint effusions. Neurologic:  Normal speech and language. No gross focal neurologic deficits  are appreciated. No gait instability. Skin:  Skin is warm, dry and intact. No rash noted. Psychiatric: Mood and affect are normal. Speech and behavior are normal.  ____________________________________________   LABS (all labs ordered are listed, but only abnormal results are displayed)  Labs Reviewed  COMPREHENSIVE METABOLIC PANEL - Abnormal; Notable for the following components:      Result Value   CO2 21 (*)    Glucose, Bld 137 (*)    ALT 13 (*)    All other components within normal limits  CBC WITH DIFFERENTIAL/PLATELET - Abnormal; Notable for the following components:   RDW 14.6 (*)    Basophils Absolute 0.2 (*)    All other components within normal limits  FIBRIN DERIVATIVES D-DIMER ([facility] ONLY) - Abnormal; Notable for the following components:   Fibrin derivatives D-dimer [facility]) 555.37 (*)    All other components within normal limits  BRAIN NATRIURETIC PEPTIDE  TROPONIN I  TROPONIN I   ____________________________________________  EKG  EKG read and interpreted by me shows normal sinus rhythm rate of 75 left axis no acute ST-T wave changes ____________________________________________  RADIOLOGY  ED MD interpretation: chest x-ray reviewed by myself does not show any acute changesCT of the chest shows no PE there is also no pneumonia I reviewed the films  Official radiology report(s): Ct Angio Chest Pe W And/or Wo Contrast  Result Date: 01/04/2018 CLINICAL DATA:  Right-sided chest pain and productive cough for several weeks EXAM: CT ANGIOGRAPHY CHEST WITH CONTRAST TECHNIQUE: Multidetector CT imaging of the chest was performed using the standard protocol during bolus administration of intravenous contrast. Multiplanar CT image reconstructions and MIPs were obtained to evaluate the vascular anatomy. CONTRAST:  75mL ISOVUE-370 IOPAMIDOL (ISOVUE-370) INJECTION 76% COMPARISON:  Plain film from earlier in the same day. FINDINGS: Cardiovascular: Thoracic aorta demonstrates mild  atherosclerotic calcifications without aneurysmal dilatation or dissection. The heart is not significantly enlarged. Diffuse coronary calcifications are noted. The pulmonary artery shows a normal branching pattern without focal filling defect to suggest pulmonary embolism. Mediastinum/Nodes: Thoracic inlet is within normal limits. No significant hilar or mediastinal adenopathy is noted. The esophagus is within normal limits. No axillary adenopathy is seen. Lungs/Pleura: The lungs are well aerated bilaterally without focal infiltrate or sizable effusion. No parenchymal nodules are seen. Upper Abdomen: Visualized upper abdomen is within normal limits. Musculoskeletal: Degenerative changes of the thoracic spine are noted. Review of the MIP images confirms the above findings. IMPRESSION: No evidence of pulmonary emboli. No acute abnormality is noted. Aortic Atherosclerosis (ICD10-I70.0). Electronically Signed   By: Alcide Clever M.D.   On: 01/04/2018 16:06   Dg Chest Portable 1 View  Result Date: 01/04/2018 CLINICAL DATA:  Shortness of breath EXAM: PORTABLE CHEST 1 VIEW COMPARISON:  12/11/2017 FINDINGS: Lungs are clear.  No pleural effusion or pneumothorax. The heart is normal in size. IMPRESSION: No evidence of acute cardiopulmonary disease. Electronically Signed   By:  Charline BillsSriyesh  Krishnan M.D.   On: 01/04/2018 15:04    ____________________________________________   PROCEDURES  Procedure(s) performed:   Procedures  Critical Care performed:   ____________________________________________   INITIAL IMPRESSION / ASSESSMENT AND PLAN / ED COURSE old records reviewed. X-ray and CT reviewed. Everything looks good. I will give her Augmentin since she's had Zithromax already. She still has her inhaler. She will return if she is worse. She can follow-up with Dr. call wood or her regular cardiologist Dr. [facility]Kahn at [facility].      Clinical Course as of Jan 04 1958  Wed Jan 04, 2018  1530 Chloride: 103  [PM]    Clinical Course User Index [PM] Arnaldo NatalMalinda, Tahirih Lair F, MD     ____________________________________________   FINAL CLINICAL IMPRESSION(S) / ED DIAGNOSES  Final diagnoses:  Chest wall pain  Bronchitis     ED Discharge Orders        Ordered    amoxicillin-clavulanate (AUGMENTIN) 875-125 MG tablet  2 times daily     01/04/18 1850       Note:  This document was prepared using Dragon voice recognition software and may include unintentional dictation errors.    Arnaldo NatalMalinda, Avrian Delfavero F, MD 01/04/18 Rosamaria [id number]

## 2018-04-21 ENCOUNTER — Other Ambulatory Visit: Payer: Self-pay | Admitting: Physician Assistant

## 2018-04-21 DIAGNOSIS — Z1231 Encounter for screening mammogram for malignant neoplasm of breast: Secondary | ICD-10-CM

## 2018-04-21 DIAGNOSIS — Z13820 Encounter for screening for osteoporosis: Secondary | ICD-10-CM

## 2018-05-24 ENCOUNTER — Other Ambulatory Visit: Payer: Medicare HMO

## 2018-11-10 ENCOUNTER — Encounter: Payer: Self-pay | Admitting: Emergency Medicine

## 2018-11-10 ENCOUNTER — Emergency Department
Admission: EM | Admit: 2018-11-10 | Discharge: 2018-11-10 | Disposition: A | Discharge status: Home / Self Care | Payer: Medicare HMO | Attending: Emergency Medicine | Admitting: Emergency Medicine

## 2018-11-10 ENCOUNTER — Other Ambulatory Visit: Payer: Self-pay

## 2018-11-10 DIAGNOSIS — M7542 Impingement syndrome of left shoulder: Secondary | ICD-10-CM | POA: Diagnosis not present

## 2018-11-10 DIAGNOSIS — I10 Essential (primary) hypertension: Secondary | ICD-10-CM | POA: Insufficient documentation

## 2018-11-10 DIAGNOSIS — Z955 Presence of coronary angioplasty implant and graft: Secondary | ICD-10-CM | POA: Insufficient documentation

## 2018-11-10 DIAGNOSIS — M25512 Pain in left shoulder: Secondary | ICD-10-CM

## 2018-11-10 DIAGNOSIS — Z79899 Other long term (current) drug therapy: Secondary | ICD-10-CM | POA: Diagnosis not present

## 2018-11-10 DIAGNOSIS — I2510 Atherosclerotic heart disease of native coronary artery without angina pectoris: Secondary | ICD-10-CM | POA: Insufficient documentation

## 2018-11-10 DIAGNOSIS — M25511 Pain in right shoulder: Secondary | ICD-10-CM | POA: Diagnosis present

## 2018-11-10 DIAGNOSIS — Z87891 Personal history of nicotine dependence: Secondary | ICD-10-CM | POA: Insufficient documentation

## 2018-11-10 DIAGNOSIS — G8929 Other chronic pain: Secondary | ICD-10-CM

## 2018-11-10 DIAGNOSIS — M7541 Impingement syndrome of right shoulder: Secondary | ICD-10-CM | POA: Insufficient documentation

## 2018-11-10 HISTORY — DX: Impingement syndrome of unspecified shoulder: M75.40

## 2018-11-10 MED ORDER — KETOROLAC TROMETHAMINE 30 MG/ML IJ SOLN
30.0000 mg | Freq: Once | INTRAMUSCULAR | Status: AC
  Administered 2018-11-10: 30 mg via INTRAMUSCULAR
  Filled 2018-11-10: qty 1

## 2018-11-10 MED ORDER — OXYCODONE HCL 5 MG PO TABS
5.0000 mg | ORAL_TABLET | Freq: Once | ORAL | Status: AC
  Administered 2018-11-10: 5 mg via ORAL
  Filled 2018-11-10: qty 1

## 2018-11-10 MED ORDER — TRAMADOL HCL 50 MG PO TABS: 50.0000 mg | ORAL_TABLET | Freq: Four times a day (QID) | ORAL | 0 refills | Status: AC | PRN

## 2018-11-10 MED ORDER — MELOXICAM 15 MG PO TABS: 15.0000 mg | ORAL_TABLET | Freq: Every day | ORAL | 0 refills | Status: AC

## 2018-11-10 NOTE — Discharge Instructions (Addendum)
Please call Brenda Best for an appointment early next week.  I have given you resources for the [facility] charity program to help with finances.  I have also placed a referral for case management to help discuss options with you.  You can take Mobic for pain and tramadol for extreme pain.  Apply heat to both shoulders.

## 2018-11-10 NOTE — ED Notes (Signed)
Says bilateral shoulder and arm pain. Already dx by [facility] with rotator cuff prob and needs to have surgery on both.  They have done injections which only helps for few days.

## 2018-11-10 NOTE — ED Triage Notes (Signed)
Pt arrived via POV with reports of bilateral shoulder pain that has been chronic for months, pt states she needs both rotator cuffs repaired, but needs to come up with some money first.  Pt states she received cortisone shot in Oct 2019 which helped only for a few minutes.

## 2018-11-10 NOTE — ED Provider Notes (Signed)
[facility] Emergency Department Provider Note  ____________________________________________  Time seen: Approximately 2:27 PM  I have reviewed the triage vital signs and the nursing notes.   HISTORY  Chief Complaint Shoulder Pain (Bilateral)    HPI Brenda Best is a 68 y.o. female that presents to the emergency department for evaluation of bilateral shoulder pain for 4 months.  Patient went to [facility] in October and was told that she will need surgery on both shoulders but she is unable to afford this.  She had steroid injections bilaterally that only helped for a short period of time.  She has had MRIs to bilateral shoulders.  Pain has not changed in character today.  She states that pain occasionally goes away for a day or 2 but always returns.  She has difficulty reaching one  arm across to the other shoulder due to pain.  No trauma.  Pain does not extend into her forearms or hands.  No shortness of breath or chest pain.  Past Medical History:  Diagnosis Date  . Coronary artery disease   . Hypertension   . Impingement syndrome of shoulder     There are no active problems to display for this patient.   Past Surgical History:  Procedure Laterality Date  . CORONARY ANGIOPLASTY WITH STENT PLACEMENT      Prior to Admission medications   Medication Sig Start Date End Date Taking? Authorizing Provider  albuterol (PROVENTIL HFA;VENTOLIN HFA) 108 (90 Base) MCG/ACT inhaler Inhale 2 puffs into the lungs every 6 (six) hours as needed for wheezing or shortness of breath. 12/11/17  Yes Darci Current, MD  benzonatate (TESSALON PERLES) 100 MG capsule Take 1 capsule (100 mg total) by mouth 3 (three) times daily as needed for cough. 12/11/17 12/11/18 Yes Darci Current, MD  budesonide-formoterol [facility]) 160-4.5 MCG/ACT inhaler Inhale 2 puffs into the lungs 2 (two) times daily.   Yes [provider]  meloxicam (MOBIC) 15 MG tablet Take 1 tablet  (15 mg total) by mouth daily for 10 days. 11/10/18 11/20/18  Enid Derry, PA-C  traMADol (ULTRAM) 50 MG tablet Take 1 tablet (50 mg total) by mouth every 6 (six) hours as needed. 11/10/18 11/10/19  Enid Derry, PA-C    Allergies Excedrin back & [acetaminophen-aspirin buffered]  No family history on file.  Social History Social History   Tobacco Use  . Smoking status: Former Smoker    Packs/day: 0.50    Years: 50.00    Pack years: 25.00    Types: Cigarettes    Last attempt to quit: 12/08/2017    Years since quitting: 0.9  . Smokeless tobacco: Never Used  Substance Use Topics  . Alcohol use: Yes    Comment: rarely  . Drug use: Yes    Types: Marijuana     Review of Systems  Cardiovascular: No chest pain. Respiratory: No SOB. Gastrointestinal: No abdominal pain.  No nausea, no vomiting.  Musculoskeletal: Positive for shoulder pain. Skin: Negative for rash, abrasions, lacerations, ecchymosis.   ____________________________________________   PHYSICAL EXAM:  VITAL SIGNS: ED Triage Vitals  Enc Vitals Group     BP 11/10/18 1325 123/69     Pulse Rate 11/10/18 1325 87     Resp 11/10/18 1325 18     Temp 11/10/18 1325 98.3 F (36.8 C)     Temp Source 11/10/18 1325 Oral     SpO2 11/10/18 1325 97 %     Weight 11/10/18 1328 164 lb (74.4 kg)  Height 11/10/18 1328 5\' 3"  (1.6 m)     Head Circumference --      Peak Flow --      Pain Score 11/10/18 1329 10     Pain Loc --      Pain Edu? --      Excl. in GC? --      Constitutional: Alert and oriented. Well appearing and in no acute distress. Eyes: Conjunctivae are normal. PERRL. EOMI. Head: Atraumatic. ENT:      Ears:      Nose: No congestion/rhinnorhea.      Mouth/Throat: Mucous membranes are moist.  Neck: No stridor.  No cervical spine tenderness to palpation. Cardiovascular: Normal rate, regular rhythm.  Good peripheral circulation. Respiratory: Normal respiratory effort without tachypnea or retractions. Lungs  CTAB. Good air entry to the bases with no decreased or absent breath sounds. Gastrointestinal: Bowel sounds 4 quadrants. Soft and nontender to palpation. No guarding or rigidity. No palpable masses. No distention.  Musculoskeletal: Full range of motion to all extremities. No gross deformities appreciated.  Limited range of motion of bilateral shoulders due to pain.  Tenderness to palpation over anterior shoulders. Neurologic:  Normal speech and language. No gross focal neurologic deficits are appreciated.  Skin:  Skin is warm, dry and intact. No rash noted. Psychiatric: Mood and affect are normal. Speech and behavior are normal. Patient exhibits appropriate insight and judgement.   ____________________________________________   LABS (all labs ordered are listed, but only abnormal results are displayed)  Labs Reviewed - No data to display ____________________________________________  EKG   ____________________________________________  RADIOLOGY   No results found.  ____________________________________________    PROCEDURES  Procedure(s) performed:    Procedures    Medications  ketorolac (TORADOL) 30 MG/ML injection 30 mg (30 mg Intramuscular Given 11/10/18 1505)  oxyCODONE (Oxy IR/ROXICODONE) immediate release tablet 5 mg (5 mg Oral Given 11/10/18 1505)     ____________________________________________   INITIAL IMPRESSION / ASSESSMENT AND PLAN / ED COURSE  Pertinent labs & imaging results that were available during my care of the patient were reviewed by me and considered in my medical decision making (see chart for details).  Review of the [place] CSRS was performed in accordance of the [facility] prior to dispensing any controlled drugs.   Patient presents emergency department for evaluation of bilateral shoulder pain.  Vital signs and exam are reassuring.  Patient has had this chronic pain for 4 months.  Patient has impingement to both shoulders and was recommended to  have surgery but has not been able to afford this.  She has had bilateral shoulder MRIs.  Pain has not changed in character today.  She was given resources for [employer] care.  Referral was placed to case management.  She was given a dose of Percocet and a IM Toradol shot here for pain.  Patient will be discharged home with prescriptions for Mobic and a short course of tramadol. Patient is to follow up with orthopedics as directed. Patient is given ED precautions to return to the ED for any worsening or new symptoms.     ____________________________________________  FINAL CLINICAL IMPRESSION(S) / ED DIAGNOSES  Final diagnoses:  Chronic pain of both shoulders  Impingement syndrome of both shoulders      NEW MEDICATIONS STARTED DURING THIS VISIT:  ED Discharge Orders         Ordered    traMADol (ULTRAM) 50 MG tablet  Every 6 hours PRN     11/10/18 1502  meloxicam (MOBIC) 15 MG tablet  Daily     11/10/18 1502              This chart was dictated using voice recognition software/Dragon. Despite best efforts to proofread, errors can occur which can change the meaning. Any change was purely unintentional.    Enid DerryWagner, Woods Gangemi, PA-C 11/10/18 1528    Don PerkingVeronese, [place], MD 11/12/18 1655

## 2019-04-07 IMAGING — DX DG CHEST 1V PORT
1 series · 1 of 1 positions shown · non-contrast
Comparison: 12/11/2017

CLINICAL DATA: Shortness of breath

EXAM:
PORTABLE CHEST 1 VIEW

[chest ap]
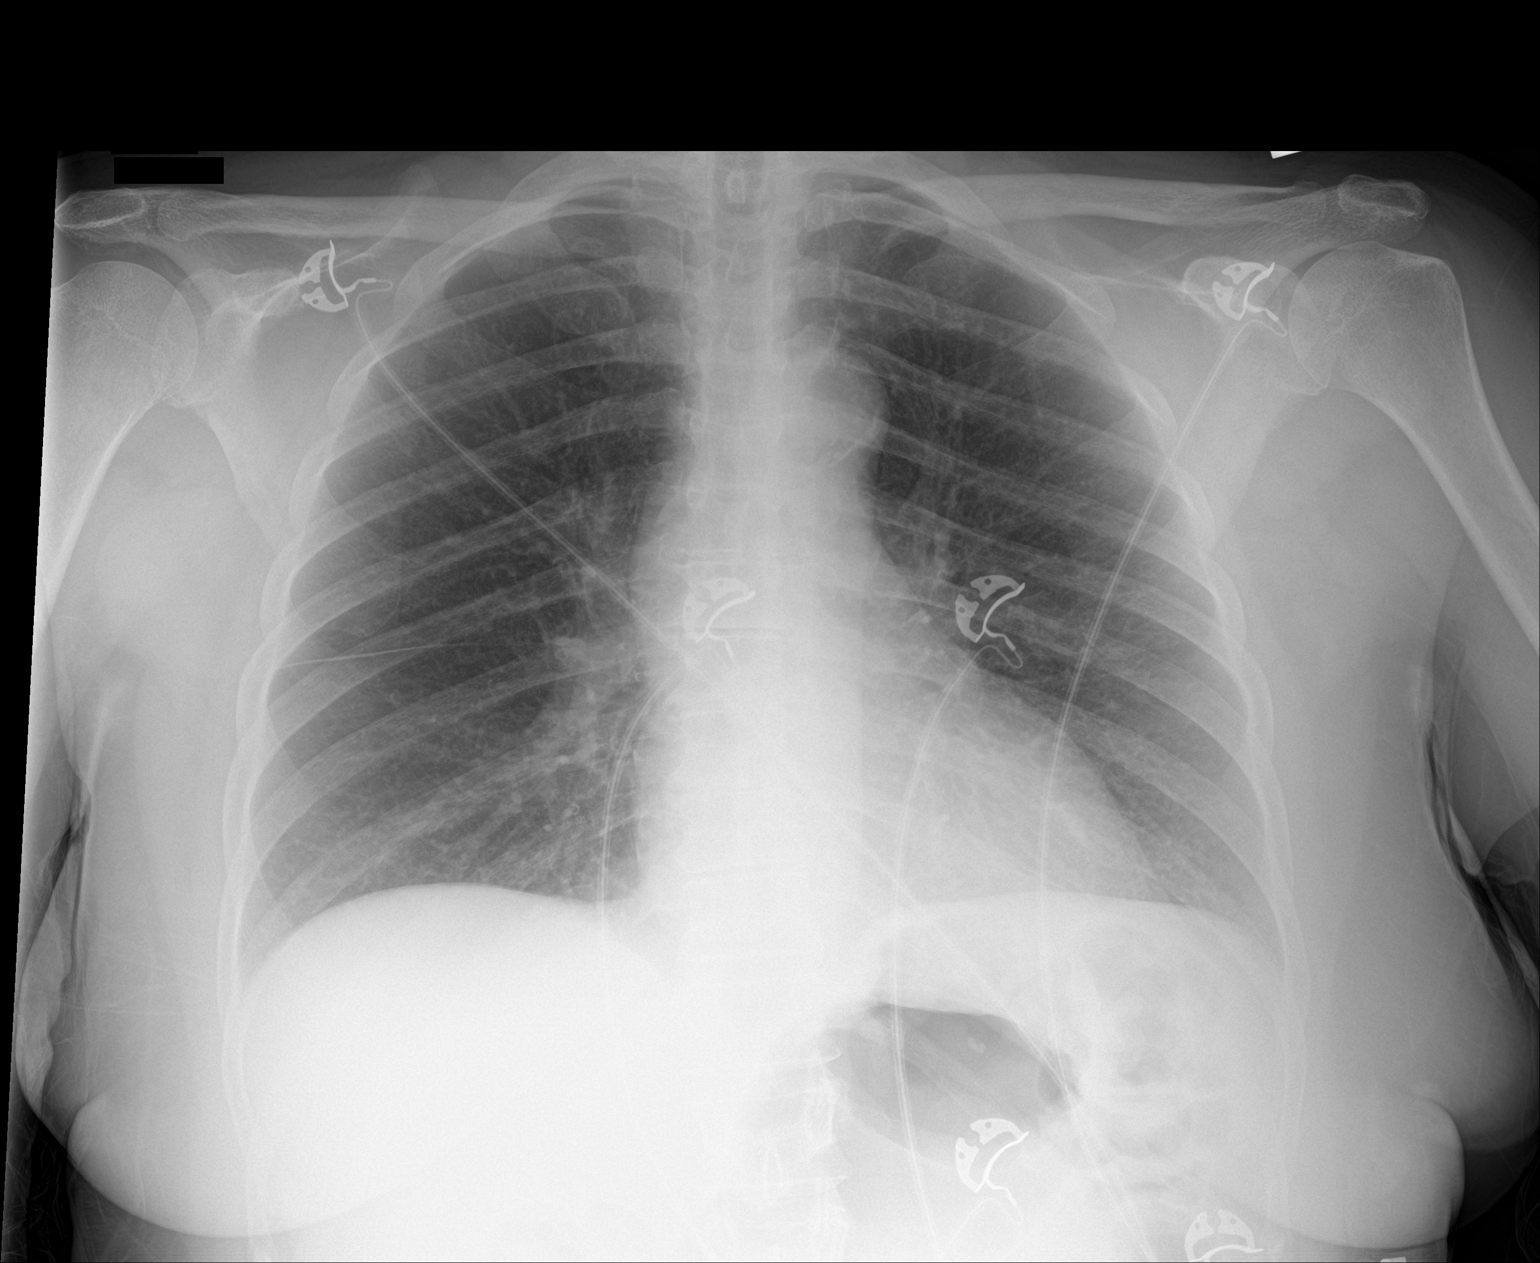

[1 of 1 positions shown; findings below may reference images not displayed]

FINDINGS: Lungs are clear.  No pleural effusion or pneumothorax.

The heart is normal in size.
IMPRESSION: No evidence of acute cardiopulmonary disease.

## 2019-04-07 IMAGING — CT CT ANGIO CHEST
2 of 6 series · 18 of 46 positions shown · IV contrast (APPLIED)
Comparison: Plain film from earlier in the same day.

CLINICAL DATA: Right-sided chest pain and productive cough for
several weeks

EXAM:
CT ANGIOGRAPHY CHEST WITH CONTRAST
TECHNIQUE: Multidetector CT imaging of the chest was performed using the
standard protocol during bolus administration of intravenous
contrast. Multiplanar CT image reconstructions and MIPs were
obtained to evaluate the vascular anatomy.
CONTRAST:  75mL [id number] IOPAMIDOL ([id number]) INJECTION 76%

[Series 5: thins · axial · 0.64mm/px · z∈[-471,-223]mm · 15 of 272 slices shown]
[im 12/272  lung]
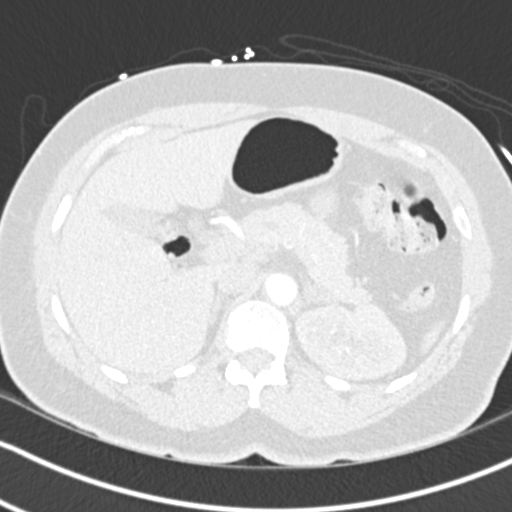
[im 36/272  soft-tissue]
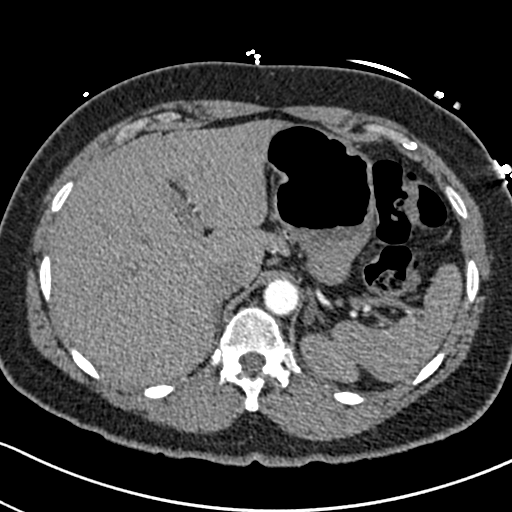
[im 48/272  lung]
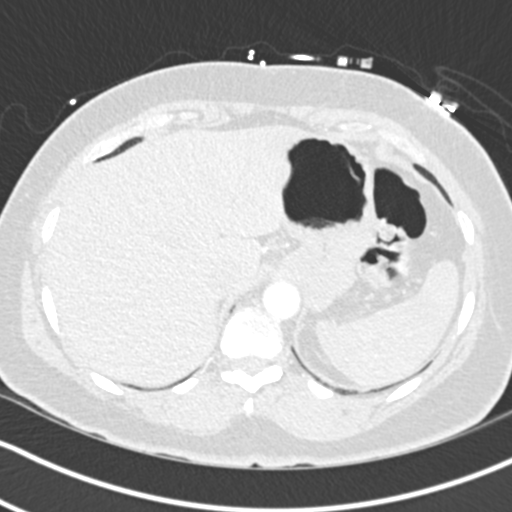
[im 71/272  soft-tissue]
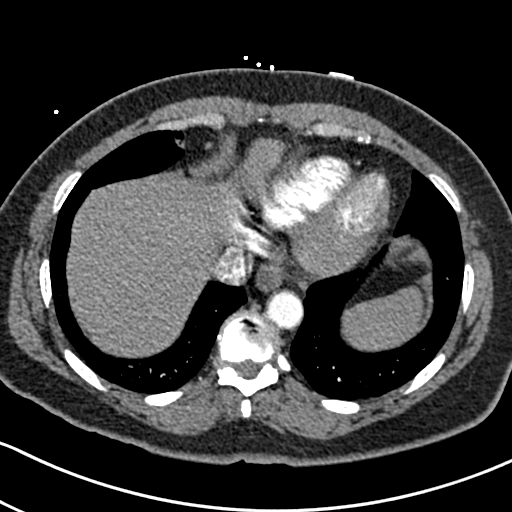
[im 83/272  lung]
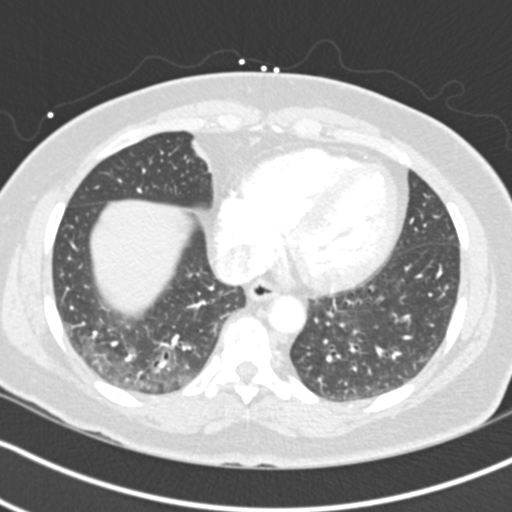
[im 107/272  soft-tissue]
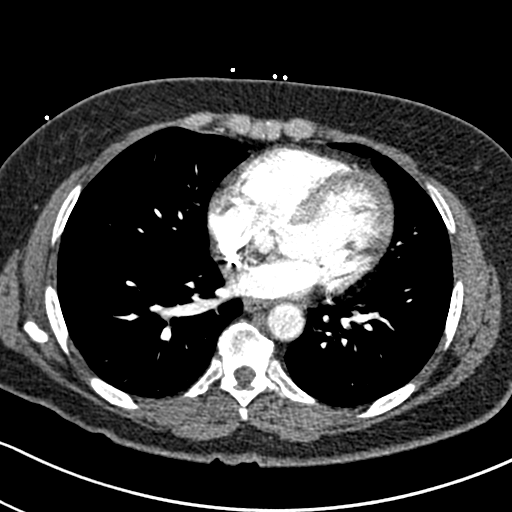
[im 118/272  lung]
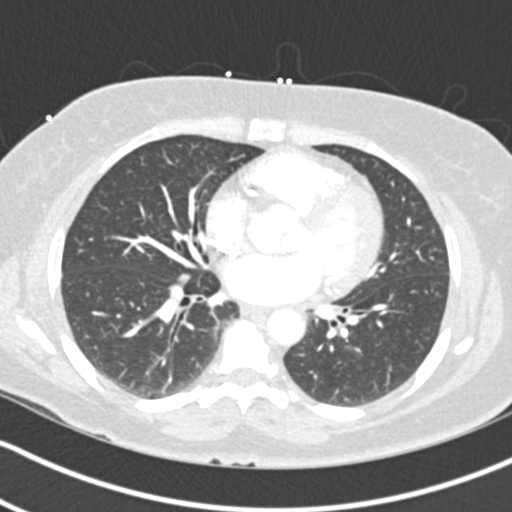
[im 142/272  soft-tissue]
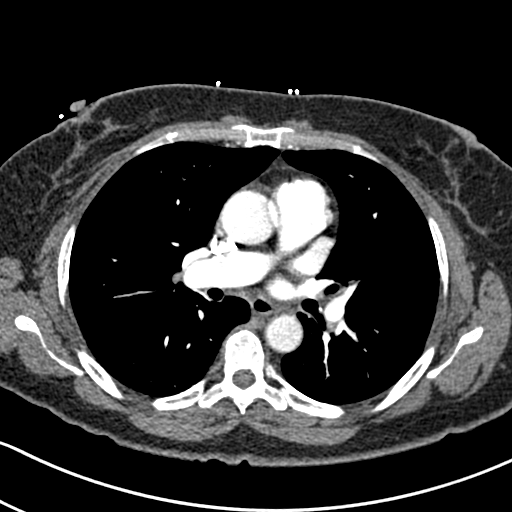
[im 154/272  lung]
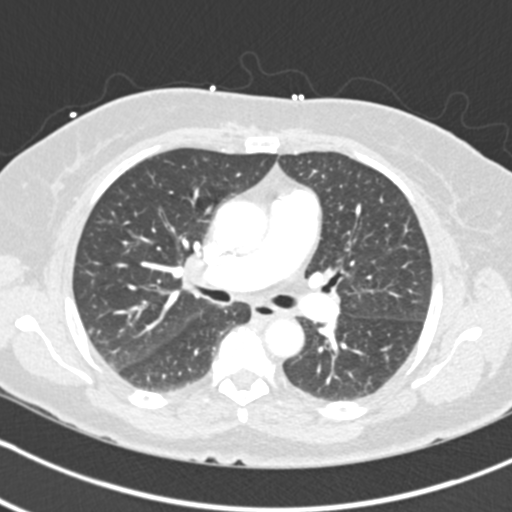
[im 165/272  soft-tissue]
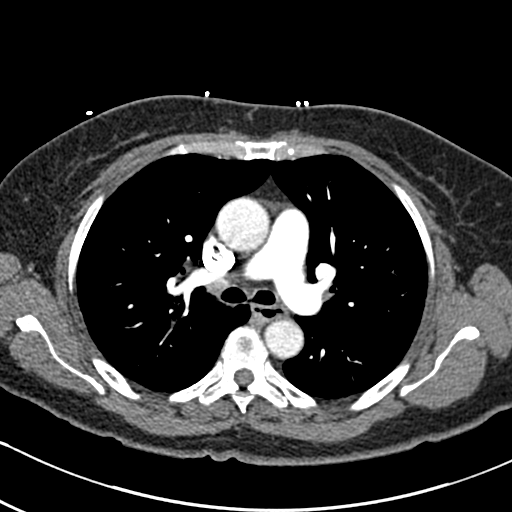
[im 189/272  lung]
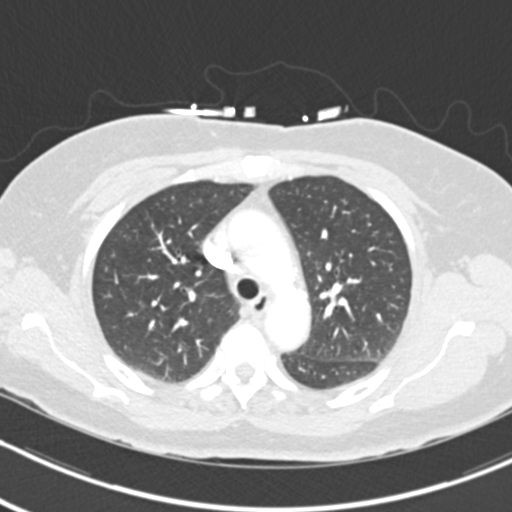
[im 201/272  soft-tissue]
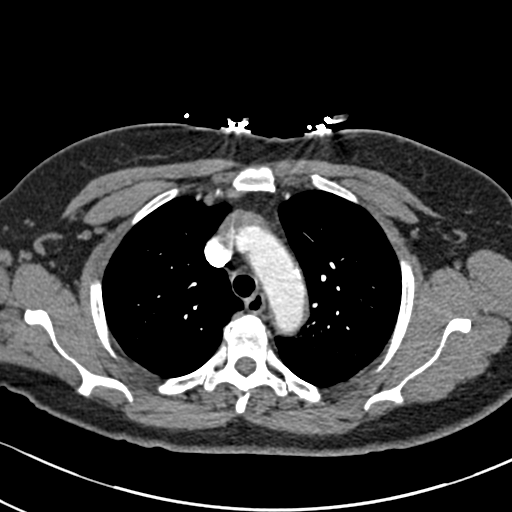
[im 224/272  lung]
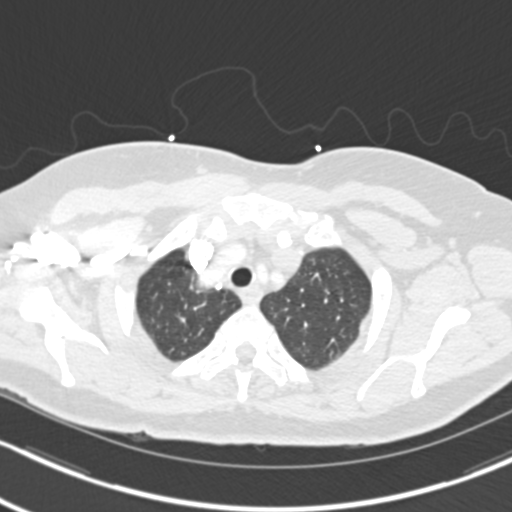
[im 236/272  soft-tissue]
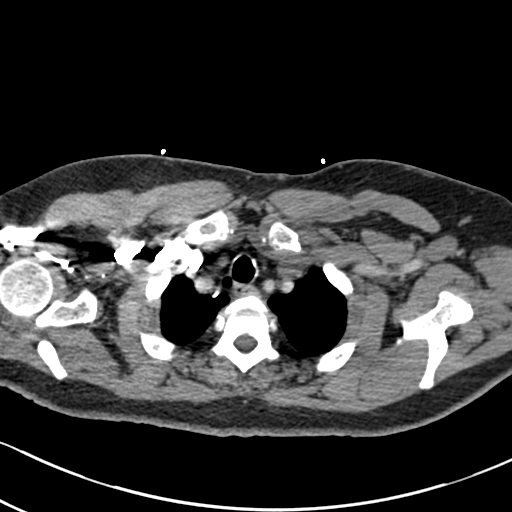
[im 260/272  lung]
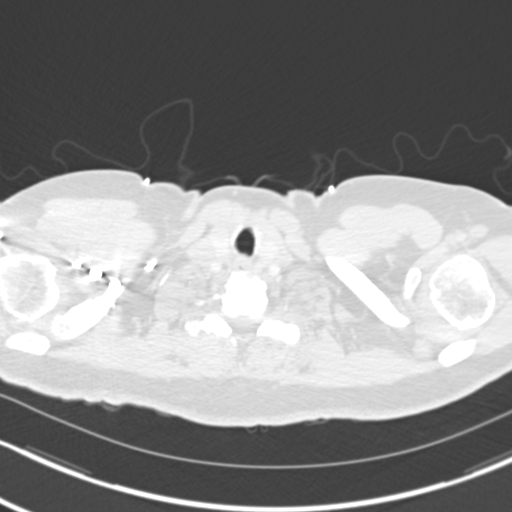

[Series 7: coronal mpr · coronal · 0.56mm/px · 3 of 94 slices shown]
[im 24/94  soft-tissue]
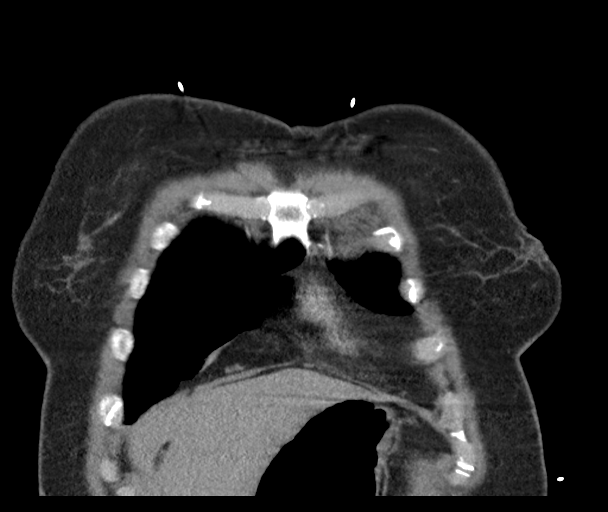
[im 47/94  soft-tissue]
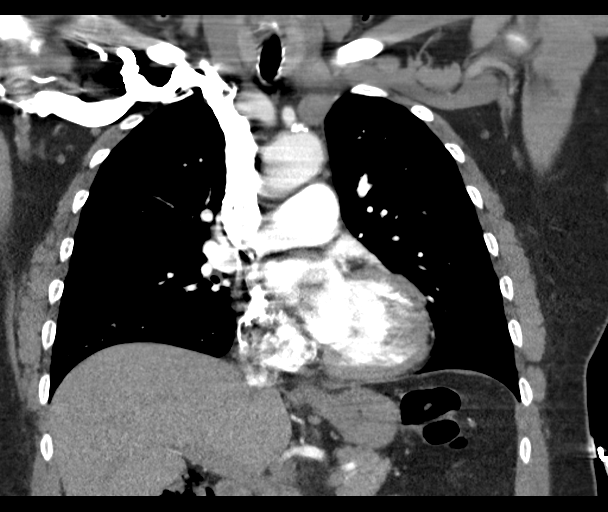
[im 70/94  soft-tissue]
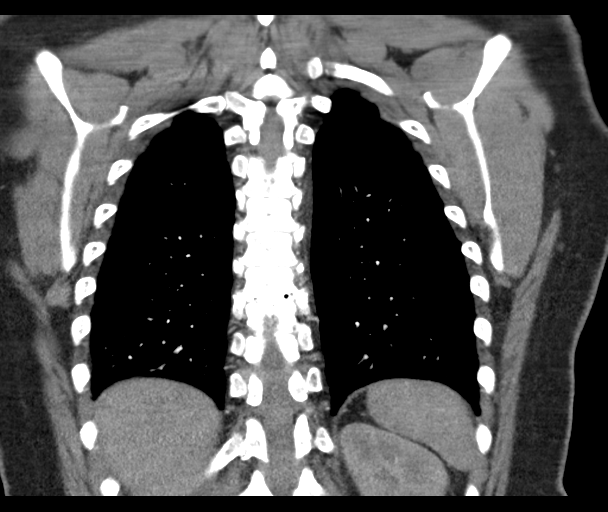

[18 of 46 positions shown; findings below may reference images not displayed]

FINDINGS: Cardiovascular: Thoracic aorta demonstrates mild atherosclerotic
calcifications without aneurysmal dilatation or dissection. The
heart is not significantly enlarged. Diffuse coronary calcifications
are noted. The pulmonary artery shows a normal branching pattern
without focal filling defect to suggest pulmonary embolism.

Mediastinum/Nodes: Thoracic inlet is within normal limits. No
significant hilar or mediastinal adenopathy is noted. The esophagus
is within normal limits. No axillary adenopathy is seen.

Lungs/Pleura: The lungs are well aerated bilaterally without focal
infiltrate or sizable effusion. No parenchymal nodules are seen.

Upper Abdomen: Visualized upper abdomen is within normal limits.

Musculoskeletal: Degenerative changes of the thoracic spine are
noted.

Review of the MIP images confirms the above findings.
IMPRESSION: No evidence of pulmonary emboli.

No acute abnormality is noted.

Aortic Atherosclerosis ([id number]).

## 2019-06-20 ENCOUNTER — Other Ambulatory Visit: Payer: Self-pay

## 2019-06-20 ENCOUNTER — Emergency Department
Admission: EM | Admit: 2019-06-20 | Discharge: 2019-06-20 | Disposition: A | Discharge status: Home / Self Care | Payer: Medicare HMO | Attending: Emergency Medicine | Admitting: Emergency Medicine

## 2019-06-20 DIAGNOSIS — Z955 Presence of coronary angioplasty implant and graft: Secondary | ICD-10-CM | POA: Insufficient documentation

## 2019-06-20 DIAGNOSIS — R197 Diarrhea, unspecified: Secondary | ICD-10-CM | POA: Insufficient documentation

## 2019-06-20 DIAGNOSIS — I2510 Atherosclerotic heart disease of native coronary artery without angina pectoris: Secondary | ICD-10-CM | POA: Diagnosis not present

## 2019-06-20 DIAGNOSIS — Z79899 Other long term (current) drug therapy: Secondary | ICD-10-CM | POA: Diagnosis not present

## 2019-06-20 DIAGNOSIS — Z87891 Personal history of nicotine dependence: Secondary | ICD-10-CM | POA: Diagnosis not present

## 2019-06-20 DIAGNOSIS — U071 COVID-19: Secondary | ICD-10-CM | POA: Insufficient documentation

## 2019-06-20 DIAGNOSIS — R109 Unspecified abdominal pain: Secondary | ICD-10-CM | POA: Diagnosis present

## 2019-06-20 DIAGNOSIS — I10 Essential (primary) hypertension: Secondary | ICD-10-CM | POA: Insufficient documentation

## 2019-06-20 LAB — COMPREHENSIVE METABOLIC PANEL
ALT: 17 U/L (ref 0–44)
AST: 27 U/L (ref 15–41)
Albumin: 3.5 g/dL (ref 3.5–5.0)
Alkaline Phosphatase: 80 U/L (ref 38–126)
Anion gap: 12 (ref 5–15)
BUN: 12 mg/dL (ref 8–23)
CO2: 23 mmol/L (ref 22–32)
Calcium: 8.7 mg/dL — ABNORMAL LOW (ref 8.9–10.3)
Chloride: 93 mmol/L — ABNORMAL LOW (ref 98–111)
Creatinine, Ser: 0.79 mg/dL (ref 0.44–1.00)
GFR calc Af Amer: >60 mL/min (ref >60)
GFR calc non Af Amer: >60 mL/min (ref >60)
Glucose, Bld: 320 mg/dL — ABNORMAL HIGH (ref 70–99)
Potassium: 3.8 mmol/L (ref 3.5–5.1)
Sodium: 128 mmol/L — ABNORMAL LOW (ref 135–145)
Total Bilirubin: 0.6 mg/dL (ref 0.3–1.2)
Total Protein: 7.3 g/dL (ref 6.5–8.1)

## 2019-06-20 LAB — URINALYSIS, COMPLETE (UACMP) WITH MICROSCOPIC
Bacteria, UA: NONE SEEN
Bilirubin Urine: NEGATIVE
Glucose, UA: >=500 mg/dL — AB
Ketones, ur: NEGATIVE mg/dL
Leukocytes,Ua: NEGATIVE
Nitrite: NEGATIVE
Protein, ur: 30 mg/dL — AB
Specific Gravity, Urine: 1.027 (ref 1.005–1.030)
pH: 5 (ref 5.0–8.0)

## 2019-06-20 LAB — SARS CORONAVIRUS 2 BY RT PCR (HOSPITAL ORDER, PERFORMED IN ~~LOC~~ HOSPITAL LAB): SARS Coronavirus 2: POSITIVE — AB

## 2019-06-20 LAB — CBC
HCT: 43.8 % (ref 36.0–46.0)
Hemoglobin: 13.7 g/dL (ref 12.0–15.0)
MCH: 27.2 pg (ref 26.0–34.0)
MCHC: 31.3 g/dL (ref 30.0–36.0)
MCV: 87.1 fL (ref 80.0–100.0)
Platelets: 148 10*3/uL — ABNORMAL LOW (ref 150–400)
RBC: 5.03 MIL/uL (ref 3.87–5.11)
RDW: 13.7 % (ref 11.5–15.5)
WBC: 6 10*3/uL (ref 4.0–10.5)
nRBC: 0 % (ref 0.0–0.2)

## 2019-06-20 LAB — LIPASE, BLOOD: Lipase: 30 U/L (ref 11–51)

## 2019-06-20 MED ORDER — DICYCLOMINE HCL 10 MG PO CAPS
20.0000 mg | ORAL_CAPSULE | Freq: Once | ORAL | Status: AC
  Administered 2019-06-20: 20 mg via ORAL
  Filled 2019-06-20: qty 2

## 2019-06-20 MED ORDER — SODIUM CHLORIDE 0.9 % IV BOLUS
1000.0000 mL | Freq: Once | INTRAVENOUS | Status: AC
  Administered 2019-06-20: 1000 mL via INTRAVENOUS

## 2019-06-20 NOTE — ED Provider Notes (Signed)
[facility] Emergency Department Provider Note   ____________________________________________   I have reviewed the triage vital signs and the nursing notes.   HISTORY  Chief Complaint Abdominal Pain and Diarrhea   History limited by: Not Limited   HPI Brenda Best is a 68 y.o. female who presents to the emergency department today because of concerns for diarrhea.  The patient states that she started having abdominal pain and discomfort yesterday.  She had multiple episodes of diarrhea.  She denies any blood in her diarrhea.  No nausea or vomiting.  She denies any fevers.  Denies any shortness of breath.    Records reviewed. Per medical record review patient has a history of CAD, HTN  Past Medical History:  Diagnosis Date  . Coronary artery disease   . Hypertension   . Impingement syndrome of shoulder     There are no active problems to display for this patient.   Past Surgical History:  Procedure Laterality Date  . CORONARY ANGIOPLASTY WITH STENT PLACEMENT      Prior to Admission medications   Medication Sig Start Date End Date Taking? Authorizing Provider  albuterol (PROVENTIL HFA;VENTOLIN HFA) 108 (90 Base) MCG/ACT inhaler Inhale 2 puffs into the lungs every 6 (six) hours as needed for wheezing or shortness of breath. 12/11/17   Gregor Hams, MD  budesonide-formoterol [facility]) 160-4.5 MCG/ACT inhaler Inhale 2 puffs into the lungs 2 (two) times daily.    [provider]  traMADol (ULTRAM) 50 MG tablet Take 1 tablet (50 mg total) by mouth every 6 (six) hours as needed. 11/10/18 11/10/19  Laban Emperor, PA-C    Allergies Excedrin back & [acetaminophen-aspirin buffered]  No family history on file.  Social History Social History   Tobacco Use  . Smoking status: Former Smoker    Packs/day: 0.50    Years: 50.00    Pack years: 25.00    Types: Cigarettes    Quit date: 12/08/2017    Years since quitting: 1.5  . Smokeless  tobacco: Never Used  Substance Use Topics  . Alcohol use: Yes    Comment: rarely  . Drug use: Yes    Types: Marijuana    Review of Systems Constitutional: No fever/chills Eyes: No visual changes. ENT: No sore throat. Cardiovascular: Denies chest pain. Respiratory: Denies shortness of breath. Gastrointestinal: Positive for abdominal pain and diarrhea. .   Genitourinary: Negative for dysuria. Musculoskeletal: Negative for back pain. Skin: Negative for rash. Neurological: Negative for headaches, focal weakness or numbness.  ____________________________________________   PHYSICAL EXAM:  VITAL SIGNS: ED Triage Vitals  Enc Vitals Group     BP 06/20/19 1106 117/70     Pulse Rate 06/20/19 1106 (!) 102     Resp 06/20/19 1106 16     Temp 06/20/19 1106 99.8 F (37.7 C)     Temp Source 06/20/19 1106 Oral     SpO2 06/20/19 1106 100 %     Weight 06/20/19 1107 161 lb (73 kg)     Height 06/20/19 1107 5\' 3"  (1.6 m)     Head Circumference --      Peak Flow --      Pain Score 06/20/19 1107 7   Constitutional: Alert and oriented.  Eyes: Conjunctivae are normal.  ENT      Head: Normocephalic and atraumatic.      Nose: No congestion/rhinnorhea.      Mouth/Throat: Mucous membranes are moist.      Neck: No stridor. Hematological/Lymphatic/Immunilogical: No  cervical lymphadenopathy. Cardiovascular: Normal rate, regular rhythm.  No murmurs, rubs, or gallops.  Respiratory: Normal respiratory effort without tachypnea nor retractions. Breath sounds are clear and equal bilaterally. No wheezes/rales/rhonchi. Gastrointestinal: Soft and non tender. No rebound. No guarding.  Genitourinary: Deferred Musculoskeletal: Normal range of motion in all extremities. No lower extremity edema. Neurologic:  Normal speech and language. No gross focal neurologic deficits are appreciated.  Skin:  Skin is warm, dry and intact. No rash noted. Psychiatric: Mood and affect are normal. Speech and behavior are  normal. Patient exhibits appropriate insight and judgment.  ____________________________________________    LABS (pertinent positives/negatives)  COVID positive Lipase 30 CMP na 128, glu 320, cr 0.79 UA negative leukocytes, 0-5 rbc and wbc CBC wbc 6.0, hgb 13.7, plt 148  ____________________________________________   EKG  I, Phineas SemenGraydon Ellanora Rayborn, attending physician, personally viewed and interpreted this EKG  EKG Time: 1106 Rate: 103 Rhythm: sinus tachycardia Axis: left axis deviation Intervals: qtc 453 QRS: LVH ST changes: no st elevation Impression: abnormal ekg  ____________________________________________    RADIOLOGY  None  ____________________________________________   PROCEDURES  Procedures  ____________________________________________   INITIAL IMPRESSION / ASSESSMENT AND PLAN / ED COURSE  Pertinent labs & imaging results that were available during my care of the patient were reviewed by me and considered in my medical decision making (see chart for details).   Patient presented to the emergency department today because of concerns for diarrhea and abdominal discomfort.  Patient did test positive for COVID.  She did state that she had a known positive COVID reaction.  Sodium slightly low. Patient was given IVFs. Will plan on discharging   ____________________________________________   FINAL CLINICAL IMPRESSION(S) / ED DIAGNOSES  Final diagnoses:  COVID-19 virus detected  Diarrhea, unspecified type     Note: This dictation was prepared with Dragon dictation. Any transcriptional errors that result from this process are unintentional     Phineas SemenGoodman, Reshaun Briseno, MD 06/20/19 [phone]

## 2019-06-20 NOTE — ED Triage Notes (Signed)
Reports abdominal pain, diarrhea and headache that started yesterday. SOB at baseline due to COPD with chronic cough. Pt alert and oriented X4, cooperative, RR even and unlabored, color WNL. Pt in NAD.

## 2019-06-20 NOTE — Discharge Instructions (Signed)
Please seek medical attention for any high fevers, chest pain, shortness of breath, change in behavior, persistent vomiting, bloody stool or any other new or concerning symptoms.  

## 2019-08-02 ENCOUNTER — Other Ambulatory Visit: Payer: Self-pay | Admitting: Physician Assistant

## 2019-08-02 DIAGNOSIS — Z1231 Encounter for screening mammogram for malignant neoplasm of breast: Secondary | ICD-10-CM

## 2019-11-02 DIAGNOSIS — U071 COVID-19: Secondary | ICD-10-CM

## 2019-11-02 HISTORY — DX: COVID-19: U07.1

## 2023-03-15 DIAGNOSIS — I10 Essential (primary) hypertension: Secondary | ICD-10-CM | POA: Diagnosis not present

## 2023-03-15 DIAGNOSIS — M19011 Primary osteoarthritis, right shoulder: Secondary | ICD-10-CM | POA: Diagnosis not present

## 2023-03-15 DIAGNOSIS — J449 Chronic obstructive pulmonary disease, unspecified: Secondary | ICD-10-CM | POA: Diagnosis not present

## 2023-03-15 DIAGNOSIS — S46011A Strain of muscle(s) and tendon(s) of the rotator cuff of right shoulder, initial encounter: Secondary | ICD-10-CM | POA: Diagnosis not present

## 2023-03-15 DIAGNOSIS — E7800 Pure hypercholesterolemia, unspecified: Secondary | ICD-10-CM | POA: Diagnosis not present

## 2023-03-15 DIAGNOSIS — S161XXA Strain of muscle, fascia and tendon at neck level, initial encounter: Secondary | ICD-10-CM | POA: Diagnosis not present

## 2023-03-15 DIAGNOSIS — M19012 Primary osteoarthritis, left shoulder: Secondary | ICD-10-CM | POA: Diagnosis not present

## 2023-03-15 DIAGNOSIS — Z955 Presence of coronary angioplasty implant and graft: Secondary | ICD-10-CM | POA: Diagnosis not present

## 2023-06-28 DIAGNOSIS — M25511 Pain in right shoulder: Secondary | ICD-10-CM | POA: Diagnosis not present

## 2023-06-28 DIAGNOSIS — M85811 Other specified disorders of bone density and structure, right shoulder: Secondary | ICD-10-CM | POA: Diagnosis not present

## 2023-06-28 DIAGNOSIS — G8929 Other chronic pain: Secondary | ICD-10-CM | POA: Diagnosis not present

## 2023-07-06 DIAGNOSIS — M75121 Complete rotator cuff tear or rupture of right shoulder, not specified as traumatic: Secondary | ICD-10-CM | POA: Diagnosis not present

## 2023-07-11 ENCOUNTER — Other Ambulatory Visit: Payer: Self-pay | Admitting: Orthopedic Surgery

## 2023-07-11 DIAGNOSIS — M75121 Complete rotator cuff tear or rupture of right shoulder, not specified as traumatic: Secondary | ICD-10-CM

## 2023-07-14 ENCOUNTER — Ambulatory Visit
Admission: RE | Admit: 2023-07-14 | Discharge: 2023-07-14 | Disposition: A | Discharge status: Home / Self Care | Payer: No Typology Code available for payment source | Source: Ambulatory Visit | Attending: Orthopedic Surgery | Admitting: Orthopedic Surgery

## 2023-07-14 ENCOUNTER — Encounter: Payer: Self-pay | Admitting: Orthopedic Surgery

## 2023-07-14 DIAGNOSIS — M75121 Complete rotator cuff tear or rupture of right shoulder, not specified as traumatic: Secondary | ICD-10-CM

## 2023-07-14 DIAGNOSIS — M25511 Pain in right shoulder: Secondary | ICD-10-CM | POA: Diagnosis not present

## 2023-07-14 DIAGNOSIS — S46011A Strain of muscle(s) and tendon(s) of the rotator cuff of right shoulder, initial encounter: Secondary | ICD-10-CM | POA: Diagnosis not present

## 2023-07-26 ENCOUNTER — Encounter: Payer: Self-pay | Admitting: Cardiovascular Disease

## 2023-07-26 ENCOUNTER — Ambulatory Visit: Payer: No Typology Code available for payment source | Admitting: Cardiovascular Disease

## 2023-07-26 VITALS — BP 110/89 | HR 80 | Ht 63.0 in | Wt 160.4 lb

## 2023-07-26 DIAGNOSIS — E782 Mixed hyperlipidemia: Secondary | ICD-10-CM

## 2023-07-26 DIAGNOSIS — I25118 Atherosclerotic heart disease of native coronary artery with other forms of angina pectoris: Secondary | ICD-10-CM | POA: Diagnosis not present

## 2023-07-26 DIAGNOSIS — I10 Essential (primary) hypertension: Secondary | ICD-10-CM | POA: Diagnosis not present

## 2023-07-26 DIAGNOSIS — R0789 Other chest pain: Secondary | ICD-10-CM

## 2023-07-26 DIAGNOSIS — R0602 Shortness of breath: Secondary | ICD-10-CM

## 2023-07-26 DIAGNOSIS — Z01810 Encounter for preprocedural cardiovascular examination: Secondary | ICD-10-CM

## 2023-07-26 NOTE — Progress Notes (Signed)
Cardiology Office Note   Date:  07/26/2023   ID:  Brenda Best, DOB Apr 23, 1951, MRN [id number]  PCP:  [facility]  Cardiologist:  Adrian Blackwater, MD      History of Present Illness: AMANEE TO is a 72 y.o. female who presents for  Chief Complaint  Patient presents with   Establish Care    Consult    Shoulders hurting      Past Medical History:  Diagnosis Date   Coronary artery disease    Hypertension    Impingement syndrome of shoulder      Past Surgical History:  Procedure Laterality Date   CORONARY ANGIOPLASTY WITH STENT PLACEMENT       Current Outpatient Medications  Medication Sig Dispense Refill   albuterol (PROVENTIL HFA;VENTOLIN HFA) 108 (90 Base) MCG/ACT inhaler Inhale 2 puffs into the lungs every 6 (six) hours as needed for wheezing or shortness of breath. 1 Inhaler 0   budesonide-formoterol (SYMBICORT) 160-4.5 MCG/ACT inhaler Inhale 2 puffs into the lungs 2 (two) times daily.     No current facility-administered medications for this visit.    Allergies:   Excedrin back & [acetaminophen-aspirin buffered]    Social History:   reports that she quit smoking about 5 years ago. Her smoking use included cigarettes. She started smoking about 55 years ago. She has a 25 pack-year smoking history. She has never used smokeless tobacco. She reports current alcohol use. She reports current drug use. Drug: Marijuana.   Family History:  family history is not on file.    ROS:     Review of Systems  Constitutional: Negative.   HENT: Negative.    Eyes: Negative.   Respiratory: Negative.    Gastrointestinal: Negative.   Genitourinary: Negative.   Musculoskeletal: Negative.   Skin: Negative.   Neurological: Negative.   Endo/Heme/Allergies: Negative.   Psychiatric/Behavioral: Negative.    All other systems reviewed and are negative.     All other systems are reviewed and negative.    PHYSICAL EXAM: VS:  BP 110/89    Pulse 80   Ht 5\' 3"  (1.6 m)   Wt 160 lb 6.4 oz (72.8 kg)   SpO2 98%   BMI 28.41 kg/m  , BMI Body mass index is 28.41 kg/m. Last weight:  Wt Readings from Last 3 Encounters:  07/26/23 160 lb 6.4 oz (72.8 kg)  06/20/19 161 lb (73 kg)  11/10/18 164 lb (74.4 kg)     Physical Exam Constitutional:      Appearance: Normal appearance.  Cardiovascular:     Rate and Rhythm: Normal rate and regular rhythm.     Heart sounds: Normal heart sounds.  Pulmonary:     Effort: Pulmonary effort is normal.     Breath sounds: Normal breath sounds.  Musculoskeletal:     Right lower leg: No edema.     Left lower leg: No edema.  Neurological:     Mental Status: She is alert.       EKG:  NSR 74/MIN NON SPECFIC ST CHANGES Recent Labs: No results found for requested labs within last 365 days.    Lipid Panel No results found for: "CHOL", "TRIG", "HDL", "CHOLHDL", "VLDL", "LDLCALC", "LDLDIRECT"    Other studies Reviewed: Additional studies/ records that were reviewed today include:  Review of the above records demonstrates:       No data to display            ASSESSMENT AND PLAN:  ICD-10-CM   1. Other chest pain  R07.89 MYOCARDIAL PERFUSION IMAGING    PCV ECHOCARDIOGRAM COMPLETE    2. SOB (shortness of breath)  R06.02 MYOCARDIAL PERFUSION IMAGING    PCV ECHOCARDIOGRAM COMPLETE    3. Coronary artery disease of native artery of native heart with stable angina pectoris (HCC)  I25.118 MYOCARDIAL PERFUSION IMAGING    PCV ECHOCARDIOGRAM COMPLETE   HAd h/o PCI/Stenting RCA 2013, no workup, adsvise echo, stress test, EKG ok.    4. Mixed hyperlipidemia  E78.2 MYOCARDIAL PERFUSION IMAGING    PCV ECHOCARDIOGRAM COMPLETE    5. Primary hypertension  I10 MYOCARDIAL PERFUSION IMAGING    PCV ECHOCARDIOGRAM COMPLETE    6. Preoperative cardiovascular examination  Z01.810    Needs shoulder suregery, will evaluate by echo/stress test       Problem List Items Addressed This Visit    None Visit Diagnoses     Other chest pain    -  Primary   Relevant Orders   MYOCARDIAL PERFUSION IMAGING   PCV ECHOCARDIOGRAM COMPLETE   SOB (shortness of breath)       Relevant Orders   MYOCARDIAL PERFUSION IMAGING   PCV ECHOCARDIOGRAM COMPLETE   Coronary artery disease of native artery of native heart with stable angina pectoris (HCC)       HAd h/o PCI/Stenting RCA 2013, no workup, adsvise echo, stress test, EKG ok.   Relevant Orders   MYOCARDIAL PERFUSION IMAGING   PCV ECHOCARDIOGRAM COMPLETE   Mixed hyperlipidemia       Relevant Orders   MYOCARDIAL PERFUSION IMAGING   PCV ECHOCARDIOGRAM COMPLETE   Primary hypertension       Relevant Orders   MYOCARDIAL PERFUSION IMAGING   PCV ECHOCARDIOGRAM COMPLETE   Preoperative cardiovascular examination       Needs shoulder suregery, will evaluate by echo/stress test          Disposition:   Return in about 2 weeks (around 08/09/2023) for ECHO, STRESS TEST AND F/U.    Total time spent: 45 minutes  Signed,  Adrian Blackwater, MD  07/26/2023 9:33 AM    [facility]

## 2023-07-28 ENCOUNTER — Encounter: Payer: Self-pay | Admitting: Cardiovascular Disease

## 2023-08-04 ENCOUNTER — Ambulatory Visit (INDEPENDENT_AMBULATORY_CARE_PROVIDER_SITE_OTHER): Payer: No Typology Code available for payment source

## 2023-08-04 DIAGNOSIS — I10 Essential (primary) hypertension: Secondary | ICD-10-CM

## 2023-08-04 DIAGNOSIS — R0789 Other chest pain: Secondary | ICD-10-CM

## 2023-08-04 DIAGNOSIS — R0602 Shortness of breath: Secondary | ICD-10-CM | POA: Diagnosis not present

## 2023-08-04 DIAGNOSIS — I25118 Atherosclerotic heart disease of native coronary artery with other forms of angina pectoris: Secondary | ICD-10-CM | POA: Diagnosis not present

## 2023-08-04 DIAGNOSIS — E782 Mixed hyperlipidemia: Secondary | ICD-10-CM

## 2023-08-04 MED ORDER — TECHNETIUM TC 99M SESTAMIBI GENERIC - CARDIOLITE
10.1000 | Freq: Once | INTRAVENOUS | Status: AC | PRN
  Administered 2023-08-04: 10.1 via INTRAVENOUS

## 2023-08-04 MED ORDER — TECHNETIUM TC 99M SESTAMIBI GENERIC - CARDIOLITE
33.3000 | Freq: Once | INTRAVENOUS | Status: AC | PRN
  Administered 2023-08-04: 33.3 via INTRAVENOUS

## 2023-08-09 ENCOUNTER — Ambulatory Visit: Payer: No Typology Code available for payment source

## 2023-08-09 DIAGNOSIS — I25118 Atherosclerotic heart disease of native coronary artery with other forms of angina pectoris: Secondary | ICD-10-CM

## 2023-08-09 DIAGNOSIS — R0602 Shortness of breath: Secondary | ICD-10-CM

## 2023-08-09 DIAGNOSIS — E782 Mixed hyperlipidemia: Secondary | ICD-10-CM

## 2023-08-09 DIAGNOSIS — I361 Nonrheumatic tricuspid (valve) insufficiency: Secondary | ICD-10-CM | POA: Diagnosis not present

## 2023-08-09 DIAGNOSIS — I351 Nonrheumatic aortic (valve) insufficiency: Secondary | ICD-10-CM | POA: Diagnosis not present

## 2023-08-09 DIAGNOSIS — R0789 Other chest pain: Secondary | ICD-10-CM

## 2023-08-09 DIAGNOSIS — I10 Essential (primary) hypertension: Secondary | ICD-10-CM

## 2023-08-15 ENCOUNTER — Encounter: Payer: Self-pay | Admitting: Cardiovascular Disease

## 2023-08-15 ENCOUNTER — Ambulatory Visit (INDEPENDENT_AMBULATORY_CARE_PROVIDER_SITE_OTHER): Payer: No Typology Code available for payment source | Admitting: Cardiovascular Disease

## 2023-08-15 VITALS — BP 130/75 | HR 82 | Ht 63.0 in | Wt 161.4 lb

## 2023-08-15 DIAGNOSIS — I259 Chronic ischemic heart disease, unspecified: Secondary | ICD-10-CM | POA: Diagnosis not present

## 2023-08-15 DIAGNOSIS — I10 Essential (primary) hypertension: Secondary | ICD-10-CM | POA: Diagnosis not present

## 2023-08-15 DIAGNOSIS — R0789 Other chest pain: Secondary | ICD-10-CM

## 2023-08-15 DIAGNOSIS — E782 Mixed hyperlipidemia: Secondary | ICD-10-CM | POA: Diagnosis not present

## 2023-08-15 DIAGNOSIS — R0602 Shortness of breath: Secondary | ICD-10-CM

## 2023-08-15 DIAGNOSIS — I25118 Atherosclerotic heart disease of native coronary artery with other forms of angina pectoris: Secondary | ICD-10-CM

## 2023-08-15 DIAGNOSIS — Z01810 Encounter for preprocedural cardiovascular examination: Secondary | ICD-10-CM | POA: Diagnosis not present

## 2023-08-15 MED ORDER — METOPROLOL TARTRATE 50 MG PO TABS
50.0000 mg | ORAL_TABLET | Freq: Two times a day (BID) | ORAL | 11 refills | Status: DC
Start: 2023-08-15 — End: 2023-08-15

## 2023-08-15 MED ORDER — METOPROLOL TARTRATE 50 MG PO TABS
ORAL_TABLET | ORAL | 0 refills | Status: DC
Start: 2023-08-15 — End: 2023-09-09

## 2023-08-15 NOTE — Progress Notes (Signed)
Cardiology Office Note   Date:  08/15/2023   ID:  Brenda Best, DOB 01/30/1951, MRN [id number]  PCP:  [facility]  Cardiologist:  Adrian Blackwater, MD      History of Present Illness: Brenda Best is a 72 y.o. female who presents for  Chief Complaint  Patient presents with   Follow-up    NST & ECHO results    Right shoulder replacement, needed and is hurting as holding it      Past Medical History:  Diagnosis Date   Coronary artery disease    Hypertension    Impingement syndrome of shoulder      Past Surgical History:  Procedure Laterality Date   CORONARY ANGIOPLASTY WITH STENT PLACEMENT       Current Outpatient Medications  Medication Sig Dispense Refill   albuterol (PROVENTIL HFA;VENTOLIN HFA) 108 (90 Base) MCG/ACT inhaler Inhale 2 puffs into the lungs every 6 (six) hours as needed for wheezing or shortness of breath. 1 Inhaler 0   budesonide-formoterol (SYMBICORT) 160-4.5 MCG/ACT inhaler Inhale 2 puffs into the lungs 2 (two) times daily.     metoprolol tartrate (LOPRESSOR) 50 MG tablet Take 1 tab night before and 2 tab 90 minutes prior to cta coronaries next morning 4 tablet 0   No current facility-administered medications for this visit.    Allergies:   Excedrin back & [acetaminophen-aspirin buffered]    Social History:   reports that she quit smoking about 5 years ago. Her smoking use included cigarettes. She started smoking about 55 years ago. She has a 25 pack-year smoking history. She has never used smokeless tobacco. She reports current alcohol use. She reports current drug use. Drug: Marijuana.   Family History:  family history is not on file.    ROS:     Review of Systems  Constitutional: Negative.   HENT: Negative.    Eyes: Negative.   Respiratory: Negative.    Gastrointestinal: Negative.   Genitourinary: Negative.   Musculoskeletal: Negative.   Skin: Negative.   Neurological: Negative.    Endo/Heme/Allergies: Negative.   Psychiatric/Behavioral: Negative.    All other systems reviewed and are negative.     All other systems are reviewed and negative.    PHYSICAL EXAM: VS:  BP 130/75   Pulse 82   Ht 5\' 3"  (1.6 m)   Wt 161 lb 6.4 oz (73.2 kg)   SpO2 95%   BMI 28.59 kg/m  , BMI Body mass index is 28.59 kg/m. Last weight:  Wt Readings from Last 3 Encounters:  08/15/23 161 lb 6.4 oz (73.2 kg)  07/26/23 160 lb 6.4 oz (72.8 kg)  06/20/19 161 lb (73 kg)     Physical Exam Constitutional:      Appearance: Normal appearance.  Cardiovascular:     Rate and Rhythm: Normal rate and regular rhythm.     Heart sounds: Normal heart sounds.  Pulmonary:     Effort: Pulmonary effort is normal.     Breath sounds: Normal breath sounds.  Musculoskeletal:     Right lower leg: No edema.     Left lower leg: No edema.  Neurological:     Mental Status: She is alert.       EKG:   Recent Labs: No results found for requested labs within last 365 days.    Lipid Panel No results found for: "CHOL", "TRIG", "HDL", "CHOLHDL", "VLDL", "LDLCALC", "LDLDIRECT"    Other studies Reviewed: Additional studies/ records that were  reviewed today include:  Review of the above records demonstrates:       No data to display            ASSESSMENT AND PLAN:    ICD-10-CM   1. Other chest pain  R07.89 Basic metabolic panel    metoprolol tartrate (LOPRESSOR) 50 MG tablet    DISCONTINUED: metoprolol tartrate (LOPRESSOR) 50 MG tablet   stress test showedischaemia LCX, advise cCTA as needs right shoulder surgery    2. SOB (shortness of breath)  R06.02 Basic metabolic panel    metoprolol tartrate (LOPRESSOR) 50 MG tablet    DISCONTINUED: metoprolol tartrate (LOPRESSOR) 50 MG tablet    3. Coronary artery disease of native artery of native heart with stable angina pectoris (HCC)  I25.118 Basic metabolic panel    metoprolol tartrate (LOPRESSOR) 50 MG tablet    DISCONTINUED:  metoprolol tartrate (LOPRESSOR) 50 MG tablet    4. Mixed hyperlipidemia  E78.2 Basic metabolic panel    metoprolol tartrate (LOPRESSOR) 50 MG tablet    DISCONTINUED: metoprolol tartrate (LOPRESSOR) 50 MG tablet    5. Primary hypertension  I10 Basic metabolic panel    metoprolol tartrate (LOPRESSOR) 50 MG tablet    DISCONTINUED: metoprolol tartrate (LOPRESSOR) 50 MG tablet    6. Preoperative cardiovascular examination  Z01.810 Basic metabolic panel    metoprolol tartrate (LOPRESSOR) 50 MG tablet    DISCONTINUED: metoprolol tartrate (LOPRESSOR) 50 MG tablet       Problem List Items Addressed This Visit   None Visit Diagnoses     Other chest pain    -  Primary   stress test showedischaemia LCX, advise cCTA as needs right shoulder surgery   Relevant Medications   metoprolol tartrate (LOPRESSOR) 50 MG tablet   Other Relevant Orders   Basic metabolic panel   SOB (shortness of breath)       Relevant Medications   metoprolol tartrate (LOPRESSOR) 50 MG tablet   Other Relevant Orders   Basic metabolic panel   Coronary artery disease of native artery of native heart with stable angina pectoris (HCC)       Relevant Medications   metoprolol tartrate (LOPRESSOR) 50 MG tablet   Other Relevant Orders   Basic metabolic panel   Mixed hyperlipidemia       Relevant Medications   metoprolol tartrate (LOPRESSOR) 50 MG tablet   Other Relevant Orders   Basic metabolic panel   Primary hypertension       Relevant Medications   metoprolol tartrate (LOPRESSOR) 50 MG tablet   Other Relevant Orders   Basic metabolic panel   Preoperative cardiovascular examination       Relevant Medications   metoprolol tartrate (LOPRESSOR) 50 MG tablet   Other Relevant Orders   Basic metabolic panel          Disposition:   Return in about 3 weeks (around 09/05/2023) for ccta and f/u.    Total time spent: 30 minutes  Signed,  Adrian Blackwater, MD  08/15/2023 9:23 AM    [facility]

## 2023-08-15 NOTE — Addendum Note (Signed)
Addended by: Adrian Blackwater A on: 08/15/2023 10:18 AM   Modules accepted: Orders

## 2023-08-16 LAB — BASIC METABOLIC PANEL
BUN/Creatinine Ratio: 20 (ref 12–28)
BUN: 17 mg/dL (ref 8–27)
CO2: 23 mmol/L (ref 20–29)
Calcium: 9.4 mg/dL (ref 8.7–10.3)
Chloride: 97 mmol/L (ref 96–106)
Creatinine, Ser: 0.86 mg/dL (ref 0.57–1.00)
Glucose: 328 mg/dL — ABNORMAL HIGH (ref 70–99)
Potassium: 4.5 mmol/L (ref 3.5–5.2)
Sodium: 133 mmol/L — ABNORMAL LOW (ref 134–144)
eGFR: 72 mL/min/{1.73_m2} (ref >59)

## 2023-08-23 ENCOUNTER — Ambulatory Visit: Payer: No Typology Code available for payment source

## 2023-08-23 DIAGNOSIS — I259 Chronic ischemic heart disease, unspecified: Secondary | ICD-10-CM | POA: Diagnosis not present

## 2023-08-23 DIAGNOSIS — I25118 Atherosclerotic heart disease of native coronary artery with other forms of angina pectoris: Secondary | ICD-10-CM

## 2023-08-23 DIAGNOSIS — R072 Precordial pain: Secondary | ICD-10-CM | POA: Diagnosis not present

## 2023-08-23 DIAGNOSIS — I10 Essential (primary) hypertension: Secondary | ICD-10-CM

## 2023-08-23 DIAGNOSIS — Z01810 Encounter for preprocedural cardiovascular examination: Secondary | ICD-10-CM

## 2023-08-23 DIAGNOSIS — E782 Mixed hyperlipidemia: Secondary | ICD-10-CM

## 2023-08-23 DIAGNOSIS — R0789 Other chest pain: Secondary | ICD-10-CM

## 2023-08-23 DIAGNOSIS — R0602 Shortness of breath: Secondary | ICD-10-CM

## 2023-08-23 MED ORDER — IOHEXOL 350 MG/ML SOLN
100.0000 mL | Freq: Once | INTRAVENOUS | Status: AC | PRN
  Administered 2023-08-23: 100 mL via INTRAVENOUS

## 2023-09-06 ENCOUNTER — Other Ambulatory Visit: Payer: No Typology Code available for payment source

## 2023-09-09 ENCOUNTER — Ambulatory Visit: Payer: No Typology Code available for payment source | Admitting: Cardiovascular Disease

## 2023-09-09 ENCOUNTER — Encounter: Payer: Self-pay | Admitting: Cardiovascular Disease

## 2023-09-09 ENCOUNTER — Ambulatory Visit (INDEPENDENT_AMBULATORY_CARE_PROVIDER_SITE_OTHER): Payer: No Typology Code available for payment source | Admitting: Cardiovascular Disease

## 2023-09-09 VITALS — BP 140/90 | HR 106 | Ht 63.0 in | Wt 163.0 lb

## 2023-09-09 DIAGNOSIS — E782 Mixed hyperlipidemia: Secondary | ICD-10-CM

## 2023-09-09 DIAGNOSIS — I10 Essential (primary) hypertension: Secondary | ICD-10-CM

## 2023-09-09 DIAGNOSIS — Z01810 Encounter for preprocedural cardiovascular examination: Secondary | ICD-10-CM | POA: Diagnosis not present

## 2023-09-09 DIAGNOSIS — I25118 Atherosclerotic heart disease of native coronary artery with other forms of angina pectoris: Secondary | ICD-10-CM

## 2023-09-09 MED ORDER — ASPIRIN 81 MG PO TBEC: 81.0000 mg | DELAYED_RELEASE_TABLET | Freq: Every day | ORAL | 12 refills | Status: AC

## 2023-09-09 MED ORDER — ISOSORBIDE MONONITRATE ER 30 MG PO TB24: 30.0000 mg | ORAL_TABLET | Freq: Every day | ORAL | 1 refills | Status: DC

## 2023-09-09 MED ORDER — ROSUVASTATIN CALCIUM 20 MG PO TABS: 20.0000 mg | ORAL_TABLET | Freq: Every day | ORAL | 11 refills | Status: DC

## 2023-09-09 MED ORDER — METOPROLOL SUCCINATE ER 25 MG PO TB24: 25.0000 mg | ORAL_TABLET | Freq: Every day | ORAL | 11 refills | Status: DC

## 2023-09-09 NOTE — Progress Notes (Signed)
Cardiology Office Note   Date:  09/09/2023   ID:  Brenda Best, DOB 1951-03-23, MRN [id number]  PCP:  [facility]  Cardiologist:  Adrian Blackwater, MD      History of Present Illness: Brenda Best is a 72 y.o. female who presents for  Chief Complaint  Patient presents with   Follow-up    Ccta results    Right shoulder hurts really bad needing replacement by [facility] ortho. No chest pain.      Past Medical History:  Diagnosis Date   Coronary artery disease    Hypertension    Impingement syndrome of shoulder      Past Surgical History:  Procedure Laterality Date   CORONARY ANGIOPLASTY WITH STENT PLACEMENT       Current Outpatient Medications  Medication Sig Dispense Refill   albuterol (PROVENTIL HFA;VENTOLIN HFA) 108 (90 Base) MCG/ACT inhaler Inhale 2 puffs into the lungs every 6 (six) hours as needed for wheezing or shortness of breath. 1 Inhaler 0   aspirin EC 81 MG tablet Take 1 tablet (81 mg total) by mouth daily. Swallow whole. 30 tablet 12   budesonide-formoterol (SYMBICORT) 160-4.5 MCG/ACT inhaler Inhale 2 puffs into the lungs 2 (two) times daily.     isosorbide mononitrate (IMDUR) 30 MG 24 hr tablet Take 1 tablet (30 mg total) by mouth daily. 30 tablet 1   metoprolol succinate (TOPROL XL) 25 MG 24 hr tablet Take 1 tablet (25 mg total) by mouth daily. 30 tablet 11   rosuvastatin (CRESTOR) 20 MG tablet Take 1 tablet (20 mg total) by mouth daily. 30 tablet 11   No current facility-administered medications for this visit.    Allergies:   Excedrin back & [acetaminophen-aspirin buffered]    Social History:   reports that she quit smoking about 5 years ago. Her smoking use included cigarettes. She started smoking about 55 years ago. She has a 25 pack-year smoking history. She has never used smokeless tobacco. She reports current alcohol use. She reports current drug use. Drug: Marijuana.   Family History:  family history is not on  file.    ROS:     Review of Systems  Constitutional: Negative.   HENT: Negative.    Eyes: Negative.   Respiratory: Negative.    Gastrointestinal: Negative.   Genitourinary: Negative.   Musculoskeletal: Negative.   Skin: Negative.   Neurological: Negative.   Endo/Heme/Allergies: Negative.   Psychiatric/Behavioral: Negative.    All other systems reviewed and are negative.     All other systems are reviewed and negative.    PHYSICAL EXAM: VS:  BP (!) 140/90   Pulse (!) 106   Ht 5\' 3"  (1.6 m)   Wt 163 lb (73.9 kg)   SpO2 92%   BMI 28.87 kg/m  , BMI Body mass index is 28.87 kg/m. Last weight:  Wt Readings from Last 3 Encounters:  09/09/23 163 lb (73.9 kg)  08/15/23 161 lb 6.4 oz (73.2 kg)  07/26/23 160 lb 6.4 oz (72.8 kg)     Physical Exam Constitutional:      Appearance: Normal appearance.  Cardiovascular:     Rate and Rhythm: Normal rate and regular rhythm.     Heart sounds: Normal heart sounds.  Pulmonary:     Effort: Pulmonary effort is normal.     Breath sounds: Normal breath sounds.  Musculoskeletal:     Right lower leg: No edema.     Left lower leg: No edema.  Neurological:     Mental Status: She is alert.       EKG:   Recent Labs: 08/15/2023: BUN 17; Creatinine, Ser 0.86; Potassium 4.5; Sodium 133    Lipid Panel No results found for: "CHOL", "TRIG", "HDL", "CHOLHDL", "VLDL", "LDLCALC", "LDLDIRECT"    Other studies Reviewed: Additional studies/ records that were reviewed today include:  Review of the above records demonstrates:       No data to display            ASSESSMENT AND PLAN:    ICD-10-CM   1. Preoperative cardiovascular examination  Z01.810 aspirin EC 81 MG tablet    metoprolol succinate (TOPROL XL) 25 MG 24 hr tablet    rosuvastatin (CRESTOR) 20 MG tablet    isosorbide mononitrate (IMDUR) 30 MG 24 hr tablet   Patient feeling btter no chest pain. Has CAD but is stable. ADVISE PROCEEDING WITH RIGHT SHOULDER SURGERY.  BEING TREATED MEDICALLY FOR CAD.    2. Coronary artery disease of native artery of native heart with stable angina pectoris (HCC)  I25.118 aspirin EC 81 MG tablet    metoprolol succinate (TOPROL XL) 25 MG 24 hr tablet    rosuvastatin (CRESTOR) 20 MG tablet    isosorbide mononitrate (IMDUR) 30 MG 24 hr tablet   [facility] SHOWED CA SCORE 2075, SEVERE CALCIFICATION LAD, STENT NO RESTENOSI, RCA 70PERCENT AND MODERATE LCX DISEASE. LVEF 56 PERCENT ECHO, MILD MR/TR    3. Mixed hyperlipidemia  E78.2 aspirin EC 81 MG tablet    metoprolol succinate (TOPROL XL) 25 MG 24 hr tablet    rosuvastatin (CRESTOR) 20 MG tablet    isosorbide mononitrate (IMDUR) 30 MG 24 hr tablet    4. Primary hypertension  I10 aspirin EC 81 MG tablet    metoprolol succinate (TOPROL XL) 25 MG 24 hr tablet    rosuvastatin (CRESTOR) 20 MG tablet    isosorbide mononitrate (IMDUR) 30 MG 24 hr tablet       Problem List Items Addressed This Visit   None Visit Diagnoses     Preoperative cardiovascular examination    -  Primary   Patient feeling btter no chest pain. Has CAD but is stable. ADVISE PROCEEDING WITH RIGHT SHOULDER SURGERY. BEING TREATED MEDICALLY FOR CAD.   Relevant Medications   aspirin EC 81 MG tablet   metoprolol succinate (TOPROL XL) 25 MG 24 hr tablet   rosuvastatin (CRESTOR) 20 MG tablet   isosorbide mononitrate (IMDUR) 30 MG 24 hr tablet   Coronary artery disease of native artery of native heart with stable angina pectoris (HCC)       [facility] SHOWED CA SCORE 2075, SEVERE CALCIFICATION LAD, STENT NO RESTENOSI, RCA 70PERCENT AND MODERATE LCX DISEASE. LVEF 56 PERCENT ECHO, MILD MR/TR   Relevant Medications   aspirin EC 81 MG tablet   metoprolol succinate (TOPROL XL) 25 MG 24 hr tablet   rosuvastatin (CRESTOR) 20 MG tablet   isosorbide mononitrate (IMDUR) 30 MG 24 hr tablet   Mixed hyperlipidemia       Relevant Medications   aspirin EC 81 MG tablet   metoprolol succinate (TOPROL XL) 25 MG 24 hr tablet    rosuvastatin (CRESTOR) 20 MG tablet   isosorbide mononitrate (IMDUR) 30 MG 24 hr tablet   Primary hypertension       Relevant Medications   aspirin EC 81 MG tablet   metoprolol succinate (TOPROL XL) 25 MG 24 hr tablet   rosuvastatin (CRESTOR) 20 MG tablet   isosorbide mononitrate (  IMDUR) 30 MG 24 hr tablet          Disposition:   Return in about 4 weeks (around 10/07/2023) for [facility] FOR SURGERY.    Total time spent: 30 minutes  Signed,  Adrian Blackwater, MD  09/09/2023 3:06 PM    [facility]

## 2023-09-12 DIAGNOSIS — M75121 Complete rotator cuff tear or rupture of right shoulder, not specified as traumatic: Secondary | ICD-10-CM | POA: Diagnosis not present

## 2023-09-13 ENCOUNTER — Other Ambulatory Visit: Payer: Self-pay | Admitting: Orthopedic Surgery

## 2023-09-14 ENCOUNTER — Encounter: Payer: Self-pay | Admitting: Urgent Care

## 2023-09-15 ENCOUNTER — Encounter
Admission: RE | Admit: 2023-09-15 | Discharge: 2023-09-15 | Disposition: A | Discharge status: Home / Self Care | Payer: No Typology Code available for payment source | Source: Ambulatory Visit | Attending: Orthopedic Surgery | Admitting: Orthopedic Surgery

## 2023-09-15 VITALS — BP 116/82 | HR 74 | Resp 14 | Ht 63.0 in | Wt 163.1 lb

## 2023-09-15 DIAGNOSIS — Z01818 Encounter for other preprocedural examination: Secondary | ICD-10-CM

## 2023-09-15 DIAGNOSIS — Z01812 Encounter for preprocedural laboratory examination: Secondary | ICD-10-CM | POA: Insufficient documentation

## 2023-09-15 DIAGNOSIS — R7309 Other abnormal glucose: Secondary | ICD-10-CM | POA: Insufficient documentation

## 2023-09-15 HISTORY — DX: Personal history of nicotine dependence: Z87.891

## 2023-09-15 HISTORY — DX: Chronic obstructive pulmonary disease, unspecified: J44.9

## 2023-09-15 HISTORY — DX: Type 2 diabetes mellitus without complications: E11.9

## 2023-09-15 HISTORY — DX: Hyperlipidemia, unspecified: E78.5

## 2023-09-15 HISTORY — DX: Unspecified rotator cuff tear or rupture of right shoulder, not specified as traumatic: M75.101

## 2023-09-15 HISTORY — DX: Cannabis use, unspecified, in remission: F12.91

## 2023-09-15 HISTORY — DX: Presence of coronary angioplasty implant and graft: Z95.5

## 2023-09-15 HISTORY — DX: Hypo-osmolality and hyponatremia: E87.1

## 2023-09-15 LAB — URINALYSIS, ROUTINE W REFLEX MICROSCOPIC
Bacteria, UA: NONE SEEN
Bilirubin Urine: NEGATIVE
Glucose, UA: >=500 mg/dL — AB
Ketones, ur: NEGATIVE mg/dL
Leukocytes,Ua: NEGATIVE
Nitrite: NEGATIVE
Protein, ur: 100 mg/dL — AB
Specific Gravity, Urine: 1.027 (ref 1.005–1.030)
pH: 6 (ref 5.0–8.0)

## 2023-09-15 LAB — CBC WITH DIFFERENTIAL/PLATELET
Abs Immature Granulocytes: 0.01 10*3/uL (ref 0.00–0.07)
Basophils Absolute: 0.1 10*3/uL (ref 0.0–0.1)
Basophils Relative: 1 %
Eosinophils Absolute: 0.2 10*3/uL (ref 0.0–0.5)
Eosinophils Relative: 2 %
HCT: 44.3 % (ref 36.0–46.0)
Hemoglobin: 13.9 g/dL (ref 12.0–15.0)
Immature Granulocytes: 0 %
Lymphocytes Relative: 35 %
Lymphs Abs: 2.5 10*3/uL (ref 0.7–4.0)
MCH: 27.5 pg (ref 26.0–34.0)
MCHC: 31.4 g/dL (ref 30.0–36.0)
MCV: 87.5 fL (ref 80.0–100.0)
Monocytes Absolute: 0.5 10*3/uL (ref 0.1–1.0)
Monocytes Relative: 7 %
Neutro Abs: 3.9 10*3/uL (ref 1.7–7.7)
Neutrophils Relative %: 55 %
Platelets: 224 10*3/uL (ref 150–400)
RBC: 5.06 MIL/uL (ref 3.87–5.11)
RDW: 14.1 % (ref 11.5–15.5)
WBC: 7.1 10*3/uL (ref 4.0–10.5)
nRBC: 0 % (ref 0.0–0.2)

## 2023-09-15 LAB — COMPREHENSIVE METABOLIC PANEL
ALT: 12 U/L (ref 0–44)
AST: 15 U/L (ref 15–41)
Albumin: 3.6 g/dL (ref 3.5–5.0)
Alkaline Phosphatase: 90 U/L (ref 38–126)
Anion gap: 10 (ref 5–15)
BUN: 14 mg/dL (ref 8–23)
CO2: 23 mmol/L (ref 22–32)
Calcium: 8.9 mg/dL (ref 8.9–10.3)
Chloride: 98 mmol/L (ref 98–111)
Creatinine, Ser: 0.81 mg/dL (ref 0.44–1.00)
GFR, Estimated: >60 mL/min (ref >60)
Glucose, Bld: 392 mg/dL — ABNORMAL HIGH (ref 70–99)
Potassium: 3.9 mmol/L (ref 3.5–5.1)
Sodium: 131 mmol/L — ABNORMAL LOW (ref 135–145)
Total Bilirubin: 0.4 mg/dL (ref <1.2)
Total Protein: 7.4 g/dL (ref 6.5–8.1)

## 2023-09-15 LAB — SURGICAL PCR SCREEN
MRSA, PCR: NEGATIVE
Staphylococcus aureus: NEGATIVE

## 2023-09-15 LAB — HEMOGLOBIN A1C
Hgb A1c MFr Bld: 12.4 % — ABNORMAL HIGH (ref 4.8–5.6)
Mean Plasma Glucose: 309.18 mg/dL

## 2023-09-15 NOTE — Patient Instructions (Addendum)
Your procedure is scheduled on:09-16-23 Friday Report to the Registration Desk on the 1st floor of the [facility].Then proceed to the 2nd floor Surgery Desk. Arrive at 7 am  REMEMBER: Instructions that are not followed completely may result in serious medical risk, up to and including death; or upon the discretion of your surgeon and anesthesiologist your surgery may need to be rescheduled.  Do not eat food after midnight the night before surgery.  No gum chewing or hard candies.  You may however, drink CLEAR liquids up to 2 hours before you are scheduled to arrive for your surgery. Do not drink anything within 2 hours of your scheduled arrival time.  Clear liquids include: - water  - apple juice without pulp - gatorade (not RED colors) - black coffee or tea (Do NOT add milk or creamers to the coffee or tea) Do NOT drink anything that is not on this list.  In addition, your doctor has ordered for you to drink the provided:  Ensure Pre-Surgery Clear Carbohydrate Drink  Drinking this carbohydrate drink up to two hours before surgery helps to reduce insulin resistance and improve patient outcomes. Please complete drinking 2 hours before scheduled arrival time.  One week prior to surgery: Stop Anti-inflammatories (NSAIDS) such as Advil, Aleve, Ibuprofen, Motrin, Naproxen, Naprosyn and Aspirin based products such as Excedrin, Goody's Powder, BC Powder.  You may however, continue to take Tylenol if needed for pain up until the day of surgery.  Continue taking all of your other prescription medications up until the day of surgery.  ON THE DAY OF SURGERY ONLY TAKE THESE MEDICATIONS WITH SIPS OF WATER: -metoprolol succinate (TOPROL XL)   Use your Albuterol Inhaler the day of surgery and bring your Albuterol Inhaler to the hospital  No Alcohol for 24 hours before or after surgery.  No Smoking including e-cigarettes for 24 hours before surgery.  No chewable tobacco products for at least 6  hours before surgery.  No nicotine patches on the day of surgery.  Do not use any "recreational" drugs for at least a week (preferably 2 weeks) before your surgery.  Please be advised that the combination of cocaine and anesthesia may have negative outcomes, up to and including death. If you test positive for cocaine, your surgery will be cancelled.  On the morning of surgery brush your teeth with toothpaste and water, you may rinse your mouth with mouthwash if you wish. Do not swallow any toothpaste or mouthwash.  Use CHG Soap as directed on instruction sheet.  Do not wear jewelry, make-up, hairpins, clips or nail polish.  For welded (permanent) jewelry: bracelets, anklets, waist bands, etc.  Please have this removed prior to surgery.  If it is not removed, there is a chance that hospital personnel will need to cut it off on the day of surgery.  Do not wear lotions, powders, or perfumes.   Do not shave body hair from the neck down 48 hours before surgery.  Contact lenses, hearing aids and dentures may not be worn into surgery.  Do not bring valuables to the hospital. [facility] is not responsible for any missing/lost belongings or valuables.   Total Shoulder Arthroplasty:  use Benzoyl Peroxide 5% Gel as directed on instruction sheet.  Notify your doctor if there is any change in your medical condition (cold, fever, infection).  Wear comfortable clothing (specific to your surgery type) to the hospital.  After surgery, you can help prevent lung complications by doing breathing exercises.  Take  deep breaths and cough every 1-2 hours. Your doctor may order a device called an Incentive Spirometer to help you take deep breaths. When coughing or sneezing, hold a pillow firmly against your incision with both hands. This is called "splinting." Doing this helps protect your incision. It also decreases belly discomfort.  If you are being admitted to the hospital overnight, leave your  suitcase in the car. After surgery it may be brought to your room.  In case of increased patient census, it may be necessary for you, the patient, to continue your postoperative care in the Same Day Surgery department.  If you are being discharged the day of surgery, you will not be allowed to drive home. You will need a responsible individual to drive you home and stay with you for 24 hours after surgery.   If you are taking public transportation, you will need to have a responsible individual with you.  Please call the Pre-admissions Testing Dept. at [phone] if you have any questions about these instructions.  Surgery Visitation Policy:  Patients having surgery or a procedure may have two visitors.  Children under the age of 1 must have an adult with them who is not the patient.  Inpatient Visitation:    Visiting hours are 7 a.m. to 8 p.m. Up to four visitors are allowed at one time in a patient room. The visitors may rotate out with other people during the day.  One visitor age 93 or older may stay with the patient overnight and must be in the room by 8 p.m.     Preparing for Surgery with CHLORHEXIDINE GLUCONATE (CHG) Soap  Chlorhexidine Gluconate (CHG) Soap  o An antiseptic cleaner that kills germs and bonds with the skin to continue killing germs even after washing  o Used for showering the night before surgery and morning of surgery  Before surgery, you can play an important role by reducing the number of germs on your skin.  CHG (Chlorhexidine gluconate) soap is an antiseptic cleanser which kills germs and bonds with the skin to continue killing germs even after washing.  Please do not use if you have an allergy to CHG or antibacterial soaps. If your skin becomes reddened/irritated stop using the CHG.  1. Shower the NIGHT BEFORE SURGERY and the MORNING OF SURGERY with CHG soap.  2. If you choose to wash your hair, wash your hair first as usual with your normal  shampoo.  3. After shampooing, rinse your hair and body thoroughly to remove the shampoo.  4. Use CHG as you would any other liquid soap. You can apply CHG directly to the skin and wash gently with a scrungie or a clean washcloth.  5. Apply the CHG soap to your body only from the neck down. Do not use on open wounds or open sores. Avoid contact with your eyes, ears, mouth, and genitals (private parts). Wash face and genitals (private parts) with your normal soap.  6. Wash thoroughly, paying special attention to the area where your surgery will be performed.  7. Thoroughly rinse your body with warm water.  8. Do not shower/wash with your normal soap after using and rinsing off the CHG soap.  9. Pat yourself dry with a clean towel.  10. Wear clean pajamas to bed the night before surgery.  12. Place clean sheets on your bed the night of your first shower and do not sleep with pets.  13. Shower again with the CHG soap on the  day of surgery prior to arriving at the hospital.  14. Do not apply any deodorants/lotions/powders.  15. Please wear clean clothes to the hospital.  Preparing for Total Shoulder Arthroplasty  Before surgery, you can play an important role by reducing the number of germs on your skin by using the following products:  Benzoyl Peroxide Gel  o Reduces the number of germs present on the skin  o Applied twice a day to shoulder area starting two days before surgery  Chlorhexidine Gluconate (CHG) Soap  o An antiseptic cleaner that kills germs and bonds with the skin to continue killing germs even after washing  o Used for showering the night before surgery and morning of surgery  BENZOYL PEROXIDE 5% GEL  Please do not use if you have an allergy to benzoyl peroxide. If your skin becomes reddened/irritated stop using the benzoyl peroxide.  Starting two days before surgery, apply as follows:  1. Apply benzoyl peroxide in the morning and at night. Apply after  taking a shower. If you are not taking a shower, clean entire shoulder front, back, and side along with the armpit with a clean wet washcloth.  2. Place a quarter-sized dollop on your shoulder and rub in thoroughly, making sure to cover the front, back, and side of your shoulder, along with the armpit.  2 days before ____ AM ____ PM 1 day before ____ AM ____ PM  3. Do this twice a day for two days. (Last application is the night before surgery, AFTER using the CHG soap).  4. Do NOT apply benzoyl peroxide gel on the day of surgery.  How to Use an Incentive Spirometer An incentive spirometer is a tool that measures how well you are filling your lungs with each breath. Learning to take long, deep breaths using this tool can help you keep your lungs clear and active. This may help to reverse or lessen your chance of developing breathing (pulmonary) problems, especially infection. You may be asked to use a spirometer: After a surgery. If you have a lung problem or a history of smoking. After a long period of time when you have been unable to move or be active. If the spirometer includes an indicator to show the highest number that you have reached, your health care provider or respiratory therapist will help you set a goal. Keep a log of your progress as told by your health care provider. What are the risks? Breathing too quickly may cause dizziness or cause you to pass out. Take your time so you do not get dizzy or light-headed. If you are in pain, you may need to take pain medicine before doing incentive spirometry. It is harder to take a deep breath if you are having pain. How to use your incentive spirometer  Sit up on the edge of your bed or on a chair. Hold the incentive spirometer so that it is in an upright position. Before you use the spirometer, breathe out normally. Place the mouthpiece in your mouth. Make sure your lips are closed tightly around it. Breathe in slowly and as deeply as  you can through your mouth, causing the piston or the ball to rise toward the top of the chamber. Hold your breath for 3-5 seconds, or for as long as possible. If the spirometer includes a coach indicator, use this to guide you in breathing. Slow down your breathing if the indicator goes above the marked areas. Remove the mouthpiece from your mouth and breathe out normally.  The piston or ball will return to the bottom of the chamber. Rest for a few seconds, then repeat the steps 10 or more times. Take your time and take a few normal breaths between deep breaths so that you do not get dizzy or light-headed. Do this every 1-2 hours when you are awake. If the spirometer includes a goal marker to show the highest number you have reached (best effort), use this as a goal to work toward during each repetition. After each set of 10 deep breaths, cough a few times. This will help to make sure that your lungs are clear. If you have an incision on your chest or abdomen from surgery, place a pillow or a rolled-up towel firmly against the incision when you cough. This can help to reduce pain while taking deep breaths and coughing. General tips When you are able to get out of bed: Walk around often. Continue to take deep breaths and cough in order to clear your lungs. Keep using the incentive spirometer until your health care provider says it is okay to stop using it. If you have been in the hospital, you may be told to keep using the spirometer at home. Contact a health care provider if: You are having difficulty using the spirometer. You have trouble using the spirometer as often as instructed. Your pain medicine is not giving enough relief for you to use the spirometer as told. You have a fever. Get help right away if: You develop shortness of breath. You develop a cough with bloody mucus from the lungs. You have fluid or blood coming from an incision site after you cough. Summary An incentive  spirometer is a tool that can help you learn to take long, deep breaths to keep your lungs clear and active. You may be asked to use a spirometer after a surgery, if you have a lung problem or a history of smoking, or if you have been inactive for a long period of time. Use your incentive spirometer as instructed every 1-2 hours while you are awake. If you have an incision on your chest or abdomen, place a pillow or a rolled-up towel firmly against your incision when you cough. This will help to reduce pain. Get help right away if you have shortness of breath, you cough up bloody mucus, or blood comes from your incision when you cough. This information is not intended to replace advice given to you by your health care provider. Make sure you discuss any questions you have with your health care provider. Document Revised: 01/07/2020 Document Reviewed: 01/07/2020 Elsevier Patient Education  2024 [employer].

## 2023-09-15 NOTE — Progress Notes (Addendum)
  Perioperative Services: Pre-Admission/Anesthesia Testing  Abnormal Lab Notification    Date: 09/15/23  Name: Brenda Best MRN:   [id number]  Re: Abnormal labs noted during PAT appointment   Provider(s) Notified: Signa Kell, MD and Center, [facility]  Notification mode: Routed and/or faxed via [facility]   ABNORMAL LAB VALUE(S): Lab Results  Component Value Date   GLUCOSE 392 (H) 09/15/2023    Notes: Patient adamant that she is not diabetic. During her PAT interview she stated, "no one has ever told me that. I don't take any medicine for it". Past glucoses levels were reviewed. Patient well over 300 mg/dL on the labs performed here at [facility]. Patient receives primary care services at [facility]. I asked the nursing staff to request last office note from them this afternoon. Note received and reviewed. As expected, patient does have a formal T2DM diagnosis. She had an A1c level of 11% in 03/2023. Her PCP noted that patient formerly on SGLT2i (empagliflozin) monotherapy, however patient self discontinued for an unknown reason. Patient was contacted by PCP back in May and advised to restart this medication. As of the time of her PCP visit, patient has not restarted the prescribed SGLT2i therapy.   Preoperative glucose elevated at 392 mg/dL today. Repeat Hgb A1c was added and found to be further elevated to 12.4%. In light of the aforementioned A1c, her diabetes stands to pose increased risks for the patient during her perioperative course and subsequent recovery period. With that being said, the benefit of improving glycemic control must be weighed against the overall risks associated with delaying a necessary elective surgical procedure for this patient.   In efforts to reduce the risk of developing SSI/PJI, or other potential perioperative complications, this communication is being sent to her surgeon Allena Katz, MD) in order to  determine if patient is deemed to be adequately optimized  for surgery. Additionally, will send copy of note to PCP so that patient can be contacted for follow up management of her diabetes diagnosis. Diagnosis was added to her medical history, as I noted that recent providers documented, based on patient report, that she was not diabetic.   Quentin Mulling, MSN, APRN, FNP-C, CEN [facility]  Perioperative Services Nurse Practitioner Phone: [phone] Fax: [phone] 09/15/23 11:01 PM

## 2023-09-16 ENCOUNTER — Ambulatory Visit
Admission: RE | Admit: 2023-09-16 | Discharge: 2023-09-16 | Disposition: A | Discharge status: Home / Self Care | Payer: No Typology Code available for payment source | Attending: Orthopedic Surgery | Admitting: Orthopedic Surgery

## 2023-09-16 ENCOUNTER — Encounter
Admission: RE | Disposition: A | Discharge status: Home / Self Care | Payer: Self-pay | Source: Home / Self Care | Attending: Orthopedic Surgery

## 2023-09-16 DIAGNOSIS — R739 Hyperglycemia, unspecified: Secondary | ICD-10-CM | POA: Insufficient documentation

## 2023-09-16 DIAGNOSIS — M75121 Complete rotator cuff tear or rupture of right shoulder, not specified as traumatic: Secondary | ICD-10-CM | POA: Diagnosis not present

## 2023-09-16 DIAGNOSIS — Z5309 Procedure and treatment not carried out because of other contraindication: Secondary | ICD-10-CM | POA: Insufficient documentation

## 2023-09-16 LAB — GLUCOSE, CAPILLARY: Glucose-Capillary: 492 mg/dL — ABNORMAL HIGH (ref 70–99)

## 2023-09-16 SURGERY — REVERSE SHOULDER ARTHROPLASTY
Anesthesia: Choice | Site: Shoulder | Laterality: Right

## 2023-09-16 MED ORDER — CHLORHEXIDINE GLUCONATE 0.12 % MT SOLN
OROMUCOSAL | Status: AC
Start: 2023-09-16 — End: ?
  Filled 2023-09-16: qty 15

## 2023-09-16 MED ORDER — TRANEXAMIC ACID-NACL 1000-0.7 MG/100ML-% IV SOLN
INTRAVENOUS | Status: AC
  Filled 2023-09-16: qty 100

## 2023-09-16 MED ORDER — CEFAZOLIN SODIUM-DEXTROSE 2-4 GM/100ML-% IV SOLN
INTRAVENOUS | Status: AC
  Filled 2023-09-16: qty 100

## 2023-09-16 SURGICAL SUPPLY — 56 items
BLADE SAGITTAL WIDE XTHICK NO (BLADE) ×1
CHLORAPREP W/TINT 26 (MISCELLANEOUS) ×1
CNTNR URN SCR LID CUP LEK RST (MISCELLANEOUS)
CONT SPEC 4OZ STRL OR WHT (MISCELLANEOUS)
COOLER POLAR GLACIER W/PUMP (MISCELLANEOUS) ×1
DERMABOND ADVANCED .7 DNX12 (GAUZE/BANDAGES/DRESSINGS)
DRAPE INCISE IOBAN 66X45 STRL (DRAPES) ×2
DRAPE SHEET LG 3/4 BI-LAMINATE (DRAPES) ×2
DRAPE TABLE BACK 80X90 (DRAPES) ×1
DRAPE U-SHAPE 47X51 STRL (DRAPES) ×1
DRSG OPSITE POSTOP 3X4 (GAUZE/BANDAGES/DRESSINGS)
DRSG OPSITE POSTOP 4X6 (GAUZE/BANDAGES/DRESSINGS)
DRSG OPSITE POSTOP 4X8 (GAUZE/BANDAGES/DRESSINGS)
DRSG TEGADERM 2-3/8X2-3/4 SM (GAUZE/BANDAGES/DRESSINGS)
ELECT REM PT RETURN 9FT ADLT (ELECTROSURGICAL) ×1
EVACUATOR 1/8 PVC DRAIN (DRAIN)
GAUZE SPONGE 2X2 STRL 8-PLY (GAUZE/BANDAGES/DRESSINGS)
GAUZE XEROFORM 1X8 LF (GAUZE/BANDAGES/DRESSINGS)
GLOVE BIOGEL PI IND STRL 8 (GLOVE) ×2
GLOVE PI ULTRA LF STRL 7.5 (GLOVE) ×2
GLOVE SURG ORTHO 8.0 STRL STRW (GLOVE) ×2
GLOVE SURG SYN 8.0 (GLOVE) ×1
GOWN STRL REUS W/ TWL LRG LVL3 (GOWN DISPOSABLE) ×2
GOWN STRL REUS W/ TWL XL LVL3 (GOWN DISPOSABLE) ×1
GOWN STRL REUS W/TWL LRG LVL3 (GOWN DISPOSABLE) ×2
GOWN STRL REUS W/TWL XL LVL3 (GOWN DISPOSABLE) ×1
HOOD PEEL AWAY T7 (MISCELLANEOUS) ×2
KIT STABILIZATION SHOULDER (MISCELLANEOUS) ×1
MANIFOLD NEPTUNE II (INSTRUMENTS) ×1
MASK FACE SPIDER DISP (MASK) ×1
MAT ABSORB FLUID 56X50 GRAY (MISCELLANEOUS) ×1
NEEDLE REVERSE CUT 1/2 CRC (NEEDLE)
NEEDLE SPNL 20GX3.5 QUINCKE YW (NEEDLE)
NS IRRIG 1000ML POUR BTL (IV SOLUTION) ×1
PACK ARTHROSCOPY SHOULDER (MISCELLANEOUS) ×1
PULSAVAC PLUS IRRIG FAN TIP (DISPOSABLE) ×1
SLING ULTRA II LG (MISCELLANEOUS)
SLING ULTRA II M (MISCELLANEOUS)
SPONGE T-LAP 18X18 ~~LOC~~+RFID (SPONGE) ×1
STAPLER SKIN PROX 35W (STAPLE)
STRAP SAFETY 5IN WIDE (MISCELLANEOUS) ×1
SUT ETHIBOND 5-0 MS/4 CCS GRN (SUTURE) ×1
SUT FIBERWIRE #2 38 BLUE 1/2 (SUTURE) ×1
SUT MNCRL AB 4-0 PS2 18 (SUTURE)
SUT PROLENE 6 0 P 1 18 (SUTURE)
SUT TICRON 2-0 30IN 311381 (SUTURE) ×2
SUT VIC AB 0 CT1 36 (SUTURE) ×1
SUT VIC AB 2-0 CT2 27 (SUTURE) ×2
SUT XBRAID 1.4 BLK/WHT (SUTURE)
SUT XBRAID 1.4 BLUE (SUTURE)
SUT XBRAID 1.4 WHITE/BLUE (SUTURE)
SUT XBRAID 2 BLACK/BLUE (SUTURE)
SYR 30ML LL (SYRINGE)
TRAP FLUID SMOKE EVACUATOR (MISCELLANEOUS) ×1
WATER STERILE IRR 500ML POUR (IV SOLUTION) ×1
WRAPON POLAR PAD SHDR XLG (MISCELLANEOUS) ×1

## 2023-09-16 NOTE — Progress Notes (Signed)
Patient arrived for SDS , noted labs 11/14 with glucose 392 and Hgb A1C 12.2.  CBG upon arrival 492. Patient with several year history of hyperglycemia upon review of records.  Does not take any diabetic/glucose lowering medications. Lab results reported to Dr. Allena Katz.  Dr. Allena Katz spoke with patient via phone and explained reasons for canceling surgery today. He as well as myself strongly recommended patient go immediately to ER for treatment. Patient states, " I am not going to the ER and there is no way I will take any insulin. I will go to the [facility] from here and be seen."  Emphasized again upon departure importance of being seen as soon as possible, preferably in ER.

## 2023-09-16 NOTE — H&P (Signed)
Patient presented in advance of planned surgery. She was found to have A1c of 12.4 and blood glucose level of 392. At my office visit with her, she denied being diagnosed with diabetes and records from her PCP office were unavailable for review. I discussed these findings with the patient over the phone and we agreed to cancel surgery for today due to significantly increased risk of complication such as infection postoperatively. I recommended ED/admission for blood glucose management, but patient indicated she would go to her PCP office directly. She can follow up with me in the next few weeks after appropriate blood glucose management plan is enacted and there is history of improved blood glucose control.

## 2023-10-10 ENCOUNTER — Ambulatory Visit: Payer: No Typology Code available for payment source | Admitting: Cardiovascular Disease

## 2023-11-10 ENCOUNTER — Encounter: Payer: Self-pay | Admitting: Family Medicine

## 2024-01-19 ENCOUNTER — Telehealth: Payer: Self-pay

## 2024-01-19 NOTE — Telephone Encounter (Signed)
 Patient was identified as falling into the [place] Measure - Diabetes.   Patient was: Patient notes barriers to diabetes care related to social drivers of health.  Patient not seen at [facility] and not listed as patient in office

## 2024-04-12 DIAGNOSIS — E785 Hyperlipidemia, unspecified: Secondary | ICD-10-CM | POA: Diagnosis not present

## 2024-04-12 DIAGNOSIS — M25512 Pain in left shoulder: Secondary | ICD-10-CM | POA: Diagnosis not present

## 2024-04-12 DIAGNOSIS — Z1159 Encounter for screening for other viral diseases: Secondary | ICD-10-CM | POA: Diagnosis not present

## 2024-04-12 DIAGNOSIS — I10 Essential (primary) hypertension: Secondary | ICD-10-CM | POA: Diagnosis not present

## 2024-04-12 DIAGNOSIS — Z13220 Encounter for screening for lipoid disorders: Secondary | ICD-10-CM | POA: Diagnosis not present

## 2024-04-12 DIAGNOSIS — E119 Type 2 diabetes mellitus without complications: Secondary | ICD-10-CM | POA: Diagnosis not present

## 2024-04-12 DIAGNOSIS — I2510 Atherosclerotic heart disease of native coronary artery without angina pectoris: Secondary | ICD-10-CM | POA: Diagnosis not present

## 2024-04-12 DIAGNOSIS — R1900 Intra-abdominal and pelvic swelling, mass and lump, unspecified site: Secondary | ICD-10-CM | POA: Diagnosis not present

## 2024-04-16 ENCOUNTER — Other Ambulatory Visit: Payer: Self-pay | Admitting: Physician Assistant

## 2024-04-16 DIAGNOSIS — R1900 Intra-abdominal and pelvic swelling, mass and lump, unspecified site: Secondary | ICD-10-CM

## 2024-04-24 ENCOUNTER — Ambulatory Visit
Admission: RE | Admit: 2024-04-24 | Discharge: 2024-04-24 | Disposition: A | Discharge status: Home / Self Care | Source: Ambulatory Visit | Attending: Physician Assistant | Admitting: Physician Assistant

## 2024-04-24 DIAGNOSIS — R1900 Intra-abdominal and pelvic swelling, mass and lump, unspecified site: Secondary | ICD-10-CM | POA: Diagnosis not present

## 2024-05-14 ENCOUNTER — Other Ambulatory Visit: Payer: Self-pay | Admitting: Cardiovascular Disease

## 2024-05-14 DIAGNOSIS — I25118 Atherosclerotic heart disease of native coronary artery with other forms of angina pectoris: Secondary | ICD-10-CM

## 2024-05-14 DIAGNOSIS — Z01810 Encounter for preprocedural cardiovascular examination: Secondary | ICD-10-CM

## 2024-05-14 DIAGNOSIS — E782 Mixed hyperlipidemia: Secondary | ICD-10-CM

## 2024-05-14 DIAGNOSIS — I10 Essential (primary) hypertension: Secondary | ICD-10-CM

## 2024-05-24 ENCOUNTER — Ambulatory Visit: Admitting: Cardiovascular Disease

## 2024-06-04 ENCOUNTER — Other Ambulatory Visit: Payer: Self-pay

## 2024-06-04 MED ORDER — CLOPIDOGREL BISULFATE 75 MG PO TABS: 75.0000 mg | ORAL_TABLET | ORAL | 0 refills | Status: DC

## 2024-06-04 NOTE — Telephone Encounter (Signed)
 Pt called requesting rx refill on plavix , please advise

## 2024-06-29 ENCOUNTER — Encounter: Payer: Self-pay | Admitting: Cardiovascular Disease

## 2024-06-29 ENCOUNTER — Ambulatory Visit (INDEPENDENT_AMBULATORY_CARE_PROVIDER_SITE_OTHER): Admitting: Cardiovascular Disease

## 2024-06-29 VITALS — BP 124/80 | HR 95 | Ht 63.0 in | Wt 164.0 lb

## 2024-06-29 DIAGNOSIS — R0602 Shortness of breath: Secondary | ICD-10-CM | POA: Diagnosis not present

## 2024-06-29 DIAGNOSIS — I25118 Atherosclerotic heart disease of native coronary artery with other forms of angina pectoris: Secondary | ICD-10-CM

## 2024-06-29 DIAGNOSIS — Z01810 Encounter for preprocedural cardiovascular examination: Secondary | ICD-10-CM

## 2024-06-29 DIAGNOSIS — E782 Mixed hyperlipidemia: Secondary | ICD-10-CM

## 2024-06-29 DIAGNOSIS — R0789 Other chest pain: Secondary | ICD-10-CM

## 2024-06-29 DIAGNOSIS — I10 Essential (primary) hypertension: Secondary | ICD-10-CM | POA: Diagnosis not present

## 2024-06-29 MED ORDER — CLOPIDOGREL BISULFATE 75 MG PO TABS: 75.0000 mg | ORAL_TABLET | ORAL | 0 refills | Status: DC

## 2024-06-29 MED ORDER — ENALAPRIL MALEATE 10 MG PO TABS: 10.0000 mg | ORAL_TABLET | Freq: Every day | ORAL | 2 refills | Status: AC

## 2024-06-29 MED ORDER — ROSUVASTATIN CALCIUM 20 MG PO TABS
20.0000 mg | ORAL_TABLET | Freq: Every day | ORAL | 11 refills | Status: AC
Start: 2024-06-29 — End: 2025-06-29

## 2024-06-29 MED ORDER — METOPROLOL SUCCINATE ER 25 MG PO TB24
25.0000 mg | ORAL_TABLET | Freq: Every day | ORAL | 11 refills | Status: AC
Start: 2024-06-29 — End: 2025-06-29

## 2024-06-29 MED ORDER — ISOSORBIDE MONONITRATE ER 30 MG PO TB24: 30.0000 mg | ORAL_TABLET | Freq: Every day | ORAL | 0 refills | Status: AC

## 2024-06-29 NOTE — Progress Notes (Signed)
 Cardiology Office Note   Date:  06/29/2024   ID:  Brenda Best, Brenda Best [birth year], MRN [id number]  PCP:  [facility]  Cardiologist:  Denyse Bathe, MD      History of Present Illness: Brenda Best is a 73 y.o. female who presents for  Chief Complaint  Patient presents with   Follow-up    Follow up    Feels good, has no medicines and as was moving and lost it.      Past Medical History:  Diagnosis Date   COPD (chronic obstructive pulmonary disease) (HCC)    Coronary artery disease    COVID-19 2021   Former smoker    History of coronary artery stent placement    2 stents   History of marijuana use    Hyperlipidemia    Hypertension    Hyponatremia    Impingement syndrome of shoulder    Rotator cuff tear, right    T2DM (type 2 diabetes mellitus) (HCC)    a.) adamant that she is not dianetic; office note from PCP (03/2023) indicates that she self discontinued SGLT2i (empagliflozin)     Past Surgical History:  Procedure Laterality Date   CORONARY ANGIOPLASTY WITH STENT PLACEMENT     TUBAL LIGATION       Current Outpatient Medications  Medication Sig Dispense Refill   albuterol  (PROVENTIL  HFA;VENTOLIN  HFA) 108 (90 Base) MCG/ACT inhaler Inhale 2 puffs into the lungs every 6 (six) hours as needed for wheezing or shortness of breath. 1 Inhaler 0   aspirin  EC 81 MG tablet Take 1 tablet (81 mg total) by mouth daily. Swallow whole. (Patient taking differently: Take 81 mg by mouth every morning. Swallow whole.) 30 tablet 12   clopidogrel  (PLAVIX ) 75 MG tablet Take 1 tablet (75 mg total) by mouth every morning. 30 tablet 0   enalapril  (VASOTEC ) 10 MG tablet Take 1 tablet (10 mg total) by mouth daily. 30 tablet 2   isosorbide  mononitrate (IMDUR ) 30 MG 24 hr tablet Take 1 tablet (30 mg total) by mouth daily. 30 tablet 0   metoprolol  succinate (TOPROL  XL) 25 MG 24 hr tablet Take 1 tablet (25 mg total) by mouth daily. 30 tablet 11    rosuvastatin  (CRESTOR ) 20 MG tablet Take 1 tablet (20 mg total) by mouth daily. 30 tablet 11   No current facility-administered medications for this visit.    Allergies:   Excedrin back & [acetaminophen-aspirin  buffered]    Social History:   reports that she quit smoking about 6 years ago. Her smoking use included cigarettes. She started smoking about 56 years ago. She has a 25 pack-year smoking history. She has never used smokeless tobacco. She reports current alcohol use. She reports that she does not currently use drugs after having used the following drugs: Marijuana.   Family History:  family history is not on file.    ROS:     Review of Systems  Constitutional: Negative.   HENT: Negative.    Eyes: Negative.   Respiratory: Negative.    Gastrointestinal: Negative.   Genitourinary: Negative.   Musculoskeletal: Negative.   Skin: Negative.   Neurological: Negative.   Endo/Heme/Allergies: Negative.   Psychiatric/Behavioral: Negative.    All other systems reviewed and are negative.     All other systems are reviewed and negative.    PHYSICAL EXAM: VS:  BP 124/80   Pulse 95   Ht 5' 3 (1.6 m)   Wt 164 lb (74.4 kg)  SpO2 94%   BMI 29.05 kg/m  , BMI Body mass index is 29.05 kg/m. Last weight:  Wt Readings from Last 3 Encounters:  06/29/24 164 lb (74.4 kg)  09/15/23 163 lb 2.3 oz (74 kg)  09/09/23 163 lb (73.9 kg)     Physical Exam Constitutional:      Appearance: Normal appearance.  Cardiovascular:     Rate and Rhythm: Normal rate and regular rhythm.     Heart sounds: Normal heart sounds.  Pulmonary:     Effort: Pulmonary effort is normal.     Breath sounds: Normal breath sounds.  Musculoskeletal:     Right lower leg: No edema.     Left lower leg: No edema.  Neurological:     Mental Status: She is alert.       EKG:   Recent Labs: 09/15/2023: ALT 12; BUN 14; Creatinine, Ser 0.81; Hemoglobin 13.9; Platelets 224; Potassium 3.9; Sodium 131    Lipid  Panel No results found for: CHOL, TRIG, HDL, CHOLHDL, VLDL, LDLCALC, LDLDIRECT    Other studies Reviewed: Additional studies/ records that were reviewed today include:  Review of the above records demonstrates:       No data to display            ASSESSMENT AND PLAN:    ICD-10-CM   1. Coronary artery disease of native artery of native heart with stable angina pectoris (HCC)  I25.118 clopidogrel  (PLAVIX ) 75 MG tablet    enalapril  (VASOTEC ) 10 MG tablet    isosorbide  mononitrate (IMDUR ) 30 MG 24 hr tablet    metoprolol  succinate (TOPROL  XL) 25 MG 24 hr tablet    rosuvastatin  (CRESTOR ) 20 MG tablet   No chest pains amnd SOB. Will refil medication as lost them.    2. Mixed hyperlipidemia  E78.2 clopidogrel  (PLAVIX ) 75 MG tablet    enalapril  (VASOTEC ) 10 MG tablet    isosorbide  mononitrate (IMDUR ) 30 MG 24 hr tablet    metoprolol  succinate (TOPROL  XL) 25 MG 24 hr tablet    rosuvastatin  (CRESTOR ) 20 MG tablet    3. Primary hypertension  I10 clopidogrel  (PLAVIX ) 75 MG tablet    enalapril  (VASOTEC ) 10 MG tablet    isosorbide  mononitrate (IMDUR ) 30 MG 24 hr tablet    metoprolol  succinate (TOPROL  XL) 25 MG 24 hr tablet    rosuvastatin  (CRESTOR ) 20 MG tablet    4. Other chest pain  R07.89 clopidogrel  (PLAVIX ) 75 MG tablet    enalapril  (VASOTEC ) 10 MG tablet    5. SOB (shortness of breath)  R06.02 clopidogrel  (PLAVIX ) 75 MG tablet    enalapril  (VASOTEC ) 10 MG tablet    6. Coronary artery disease of native artery of native heart with stable angina pectoris (HCC)  I25.118 clopidogrel  (PLAVIX ) 75 MG tablet    enalapril  (VASOTEC ) 10 MG tablet    isosorbide  mononitrate (IMDUR ) 30 MG 24 hr tablet    metoprolol  succinate (TOPROL  XL) 25 MG 24 hr tablet    rosuvastatin  (CRESTOR ) 20 MG tablet   [facility] SHOWED CA SCORE 2075, SEVERE CALCIFICATION LAD, STENT NO RESTENOSI, RCA 70PERCENT AND MODERATE LCX DISEASE. LVEF 56 PERCENT ECHO, MILD MR/TR    7. Preoperative cardiovascular  examination  Z01.810 clopidogrel  (PLAVIX ) 75 MG tablet    enalapril  (VASOTEC ) 10 MG tablet    isosorbide  mononitrate (IMDUR ) 30 MG 24 hr tablet    metoprolol  succinate (TOPROL  XL) 25 MG 24 hr tablet    rosuvastatin  (CRESTOR ) 20 MG tablet   Patient feeling btter  no chest pain. Has CAD but is stable. ADVISE PROCEEDING WITH RIGHT SHOULDER SURGERY. BEING TREATED MEDICALLY FOR CAD.       Problem List Items Addressed This Visit   None Visit Diagnoses       Coronary artery disease of native artery of native heart with stable angina pectoris (HCC)    -  Primary   No chest pains amnd SOB. Will refil medication as lost them.   Relevant Medications   clopidogrel  (PLAVIX ) 75 MG tablet   enalapril  (VASOTEC ) 10 MG tablet   isosorbide  mononitrate (IMDUR ) 30 MG 24 hr tablet   metoprolol  succinate (TOPROL  XL) 25 MG 24 hr tablet   rosuvastatin  (CRESTOR ) 20 MG tablet     Mixed hyperlipidemia       Relevant Medications   clopidogrel  (PLAVIX ) 75 MG tablet   enalapril  (VASOTEC ) 10 MG tablet   isosorbide  mononitrate (IMDUR ) 30 MG 24 hr tablet   metoprolol  succinate (TOPROL  XL) 25 MG 24 hr tablet   rosuvastatin  (CRESTOR ) 20 MG tablet     Primary hypertension       Relevant Medications   clopidogrel  (PLAVIX ) 75 MG tablet   enalapril  (VASOTEC ) 10 MG tablet   isosorbide  mononitrate (IMDUR ) 30 MG 24 hr tablet   metoprolol  succinate (TOPROL  XL) 25 MG 24 hr tablet   rosuvastatin  (CRESTOR ) 20 MG tablet     Other chest pain       Relevant Medications   clopidogrel  (PLAVIX ) 75 MG tablet   enalapril  (VASOTEC ) 10 MG tablet     SOB (shortness of breath)       Relevant Medications   clopidogrel  (PLAVIX ) 75 MG tablet   enalapril  (VASOTEC ) 10 MG tablet     Coronary artery disease of native artery of native heart with stable angina pectoris (HCC)       [facility] SHOWED CA SCORE 2075, SEVERE CALCIFICATION LAD, STENT NO RESTENOSI, RCA 70PERCENT AND MODERATE LCX DISEASE. LVEF 56 PERCENT ECHO, MILD MR/TR   Relevant  Medications   clopidogrel  (PLAVIX ) 75 MG tablet   enalapril  (VASOTEC ) 10 MG tablet   isosorbide  mononitrate (IMDUR ) 30 MG 24 hr tablet   metoprolol  succinate (TOPROL  XL) 25 MG 24 hr tablet   rosuvastatin  (CRESTOR ) 20 MG tablet     Preoperative cardiovascular examination       Patient feeling btter no chest pain. Has CAD but is stable. ADVISE PROCEEDING WITH RIGHT SHOULDER SURGERY. BEING TREATED MEDICALLY FOR CAD.   Relevant Medications   clopidogrel  (PLAVIX ) 75 MG tablet   enalapril  (VASOTEC ) 10 MG tablet   isosorbide  mononitrate (IMDUR ) 30 MG 24 hr tablet   metoprolol  succinate (TOPROL  XL) 25 MG 24 hr tablet   rosuvastatin  (CRESTOR ) 20 MG tablet          Disposition:   Return in about 3 months (around 09/29/2024).    Total time spent: 30 minutes  Signed,  Denyse Bathe, MD  06/29/2024 9:53 AM    [facility]

## 2024-08-08 ENCOUNTER — Other Ambulatory Visit: Payer: Self-pay | Admitting: Cardiovascular Disease

## 2024-08-08 DIAGNOSIS — R0602 Shortness of breath: Secondary | ICD-10-CM

## 2024-08-08 DIAGNOSIS — I10 Essential (primary) hypertension: Secondary | ICD-10-CM

## 2024-08-08 DIAGNOSIS — Z01810 Encounter for preprocedural cardiovascular examination: Secondary | ICD-10-CM

## 2024-08-08 DIAGNOSIS — R0789 Other chest pain: Secondary | ICD-10-CM

## 2024-08-08 DIAGNOSIS — I25118 Atherosclerotic heart disease of native coronary artery with other forms of angina pectoris: Secondary | ICD-10-CM

## 2024-08-08 DIAGNOSIS — E782 Mixed hyperlipidemia: Secondary | ICD-10-CM

## 2024-09-24 ENCOUNTER — Ambulatory Visit: Admitting: Cardiovascular Disease

## 2024-11-29 ENCOUNTER — Other Ambulatory Visit: Payer: Self-pay

## 2024-11-29 DIAGNOSIS — R0789 Other chest pain: Secondary | ICD-10-CM

## 2024-11-29 DIAGNOSIS — Z01810 Encounter for preprocedural cardiovascular examination: Secondary | ICD-10-CM

## 2024-11-29 DIAGNOSIS — I25118 Atherosclerotic heart disease of native coronary artery with other forms of angina pectoris: Secondary | ICD-10-CM

## 2024-11-29 DIAGNOSIS — R0602 Shortness of breath: Secondary | ICD-10-CM

## 2024-11-29 DIAGNOSIS — I10 Essential (primary) hypertension: Secondary | ICD-10-CM

## 2024-11-29 DIAGNOSIS — E782 Mixed hyperlipidemia: Secondary | ICD-10-CM

## 2024-12-05 ENCOUNTER — Other Ambulatory Visit: Payer: Self-pay | Admitting: Cardiovascular Disease

## 2024-12-05 DIAGNOSIS — E782 Mixed hyperlipidemia: Secondary | ICD-10-CM

## 2024-12-05 DIAGNOSIS — Z01810 Encounter for preprocedural cardiovascular examination: Secondary | ICD-10-CM

## 2024-12-05 DIAGNOSIS — R0602 Shortness of breath: Secondary | ICD-10-CM

## 2024-12-05 DIAGNOSIS — I10 Essential (primary) hypertension: Secondary | ICD-10-CM

## 2024-12-05 DIAGNOSIS — R0789 Other chest pain: Secondary | ICD-10-CM

## 2024-12-05 DIAGNOSIS — I25118 Atherosclerotic heart disease of native coronary artery with other forms of angina pectoris: Secondary | ICD-10-CM
